# Patient Record
Sex: Female | Born: 1990 | Race: Black or African American | Hispanic: No | Marital: Single | Smoking: Never smoker
Health system: Southern US, Community
[De-identification: ages and names within clinical notes are randomized; demographics above are authoritative.]

## PROBLEM LIST (undated history)

## (undated) DIAGNOSIS — Z6841 Body Mass Index (BMI) 40.0 and over, adult: Secondary | ICD-10-CM

## (undated) DIAGNOSIS — B999 Unspecified infectious disease: Secondary | ICD-10-CM

## (undated) DIAGNOSIS — Z8619 Personal history of other infectious and parasitic diseases: Secondary | ICD-10-CM

## (undated) HISTORY — PX: WISDOM TOOTH EXTRACTION: SHX21

## (undated) HISTORY — PX: VAGINA SURGERY: SHX829

## (undated) HISTORY — DX: Unspecified infectious disease: B99.9

## (undated) HISTORY — DX: Morbid (severe) obesity due to excess calories: E66.01

## (undated) HISTORY — DX: Body Mass Index (BMI) 40.0 and over, adult: Z684

## (undated) HISTORY — DX: Personal history of other infectious and parasitic diseases: Z86.19

## (undated) HISTORY — PX: HYMENECTOMY: SHX987

## (undated) HISTORY — PX: ARTHROSCOPIC REPAIR ACL: SUR80

---
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
## 2011-07-06 ENCOUNTER — Inpatient Hospital Stay (INDEPENDENT_AMBULATORY_CARE_PROVIDER_SITE_OTHER)
Admission: RE | Admit: 2011-07-06 | Discharge: 2011-07-06 | Disposition: A | Discharge status: Home / Self Care | Payer: Medicaid Other | Source: Ambulatory Visit | Attending: Family Medicine | Admitting: Family Medicine

## 2011-07-06 DIAGNOSIS — S93409A Sprain of unspecified ligament of unspecified ankle, initial encounter: Secondary | ICD-10-CM

## 2012-02-23 ENCOUNTER — Ambulatory Visit (INDEPENDENT_AMBULATORY_CARE_PROVIDER_SITE_OTHER): Payer: Medicaid Other | Admitting: Obstetrics and Gynecology

## 2012-02-23 ENCOUNTER — Encounter: Payer: Self-pay | Admitting: Obstetrics and Gynecology

## 2012-02-23 VITALS — BP 101/64 | HR 77 | Ht 60.0 in | Wt 221.0 lb

## 2012-02-23 DIAGNOSIS — Z113 Encounter for screening for infections with a predominantly sexual mode of transmission: Secondary | ICD-10-CM

## 2012-02-23 DIAGNOSIS — Z30017 Encounter for initial prescription of implantable subdermal contraceptive: Secondary | ICD-10-CM

## 2012-02-23 DIAGNOSIS — Z3046 Encounter for surveillance of implantable subdermal contraceptive: Secondary | ICD-10-CM

## 2012-02-23 NOTE — Progress Notes (Signed)
  Subjective:    Patient ID: Sonya Schwartz, female    DOB: [birth year], 21 y.o.   MRN: [id number]  HPI 21 yo GO with BMI 43 presenting today for implanon removal and re-insertion. Patient had implanon placed in May 2010. Patient is satisfied with this method of birth control reporting occasional spotting every 3 months. Patient also requesting STD testing. Patient otherwise without complaints   Past Medical History  Diagnosis Date  . Infection 2011    CHLAMYDIA   Past Surgical History  Procedure Date  . Wisdom tooth extraction     x 4  . Vagina surgery     at age 51. for menstrual flow.   Family History  Problem Relation Age of Onset  . Hypertension Mother   . Heart disease Mother   . Arthritis Mother   . Bronchiolitis Brother   . Bronchiolitis Sister    History  Substance Use Topics  . Smoking status: Never Smoker   . Smokeless tobacco: Not on file  . Alcohol Use: No     rare    Review of Systems     Objective:   Physical Exam  GENERAL: Well-developed, well-nourished female in no acute distress.  ABDOMEN: Soft, nontender, nondistended. No organomegaly. PELVIC: Normal external female genitalia. Vagina is pink and rugated.  Normal discharge. Normal appearing cervix. Uterus is normal in size.  No adnexal mass or tenderness. EXTREMITIES: No cyanosis, clubbing, or edema, 2+ distal pulses.       Assessment & Plan:  Patient given informed consent, signed copy in the chart, time out was performed. Pregnancy test was negative.  Removal Patient given informed consent for removal of her Implanon, time out was performed.  Signed copy in the chart.  Appropriate time out taken. Implanon site identified.  Area prepped in usual sterile fashon. One cc of 1% lidocaine was used to anesthetize the area at the distal end of the implant. A small stab incision was made right beside the implant on the distal portion.  The implanon rod was grasped using hemostats and removed without  difficulty.  There was less than 3 cc blood loss. There were no complications.    INSERTION The device was confirmed in needle. Using the previously made incision and then inserted full length of needle and withdrawn per handbook instructions.  Pt insertion site covered with 4x4.   Minimal blood loss.  Pt tolerated the procedure well. A small amount of antibiotic ointment and steri-strips were applied over the small incision.  A pressure bandage was applied to reduce any bruising.  The patient tolerated the procedure well and was given post procedure instructions.

## 2012-02-24 LAB — GC PROBE AMPLIFICATION, URINE: GC Probe Amp, Urine: NEGATIVE

## 2012-03-09 ENCOUNTER — Ambulatory Visit: Payer: Medicaid Other | Admitting: Obstetrics & Gynecology

## 2012-03-15 ENCOUNTER — Ambulatory Visit: Payer: Medicaid Other | Admitting: Obstetrics & Gynecology

## 2012-04-08 ENCOUNTER — Encounter (HOSPITAL_COMMUNITY): Payer: Self-pay | Admitting: *Deleted

## 2012-04-08 ENCOUNTER — Emergency Department (HOSPITAL_COMMUNITY)
Admission: EM | Admit: 2012-04-08 | Discharge: 2012-04-08 | Disposition: A | Discharge status: Home / Self Care | Payer: Medicaid Other | Attending: Emergency Medicine | Admitting: Emergency Medicine

## 2012-04-08 DIAGNOSIS — S30860A Insect bite (nonvenomous) of lower back and pelvis, initial encounter: Secondary | ICD-10-CM | POA: Insufficient documentation

## 2012-04-08 DIAGNOSIS — W57XXXA Bitten or stung by nonvenomous insect and other nonvenomous arthropods, initial encounter: Secondary | ICD-10-CM | POA: Insufficient documentation

## 2012-04-08 DIAGNOSIS — S90569A Insect bite (nonvenomous), unspecified ankle, initial encounter: Secondary | ICD-10-CM | POA: Insufficient documentation

## 2012-04-08 MED ORDER — DIPHENHYDRAMINE HCL 25 MG PO CAPS: 50.0000 mg | ORAL_CAPSULE | Freq: Once | ORAL | Status: DC

## 2012-04-08 MED ORDER — DIPHENHYDRAMINE HCL 50 MG PO CAPS: 50.0000 mg | ORAL_CAPSULE | ORAL | Status: DC | PRN

## 2012-04-08 NOTE — ED Provider Notes (Signed)
History     CSN: [id number]  Arrival date & time 04/08/12  Sonya Schwartz   First MD Initiated Contact with Patient 04/08/12 1957      Chief Complaint  Patient presents with  . 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  Patient is a 21 y.o. female presenting with rash. The history is provided by the patient. No language interpreter was used.  Rash  This is a new problem. The current episode started yesterday. The problem has not changed since onset.The problem is associated with nothing. There has been no fever. The rash is present on the abdomen and left upper leg. The pain is at a severity of 0/10. The patient is experiencing no pain. The pain has been constant since onset. Associated symptoms include itching. She has tried nothing for the symptoms. The treatment provided no relief.    Past Medical History  Diagnosis Date  . Infection 2011    CHLAMYDIA    Past Surgical History  Procedure Date  . Wisdom tooth extraction     x 4  . Vagina surgery     at age 89. for menstrual flow.    Family History  Problem Relation Age of Onset  . Hypertension Mother   . Heart disease Mother   . Arthritis Mother   . Bronchiolitis Brother   . Bronchiolitis Sister     History  Substance Use Topics  . Smoking status: Never Smoker   . Smokeless tobacco: Not on file  . Alcohol Use: No     rare    OB History    Grav Para Term Preterm Abortions TAB SAB Ect Mult Living   0 0 0 0 0 0 0 0 0 0       Review of Systems  Constitutional: Negative for fever and chills.  HENT: Negative for facial swelling and neck pain.   Eyes: Negative for pain.  Respiratory: Negative for  chest tightness and shortness of breath.   Cardiovascular: Negative for chest pain.  Gastrointestinal: Negative for nausea, vomiting and abdominal pain.  Genitourinary: Negative for dysuria.  Musculoskeletal: Negative for back pain.  Skin: Positive for itching and rash.  Neurological: Negative for headaches.  All other systems reviewed and are negative.    Allergies  Review of patient's allergies indicates no known allergies.  Home Medications   Current Outpatient Rx  Name Route Sig Dispense Refill  . IBUPROFEN 200 MG PO TABS Oral Take 400 mg by mouth every 6 (six) hours as needed. For pain      BP 130/79  Pulse 104  Temp 98.7 F (37.1 C) (Oral)  Resp 16  SpO2 100%  Physical Exam  Nursing note and vitals reviewed. Constitutional: She is oriented to person, place, and time. She appears well-developed and well-nourished. No distress.  HENT:  Head: Normocephalic and atraumatic.  Right Ear: External ear normal.  Left Ear: External ear normal.  Nose: Nose normal.  Mouth/Throat: Oropharynx is clear and moist. No oropharyngeal exudate.  Eyes: Conjunctivae are normal. Pupils are equal, round, and reactive to light. No scleral icterus.  Neck: Normal range of motion. Neck supple.  Cardiovascular: Normal rate, regular rhythm and normal heart sounds.  Exam  reveals no gallop and no friction rub.   No murmur heard. Pulmonary/Chest: Effort normal and breath sounds normal. No respiratory distress. She has no wheezes. She has no rales. She exhibits no tenderness.  Abdominal: Soft. Bowel sounds are normal. She exhibits no distension and no mass. There is no tenderness. There is no rebound and no guarding.  Musculoskeletal: Normal range of motion. She exhibits no edema and no tenderness.  Lymphadenopathy:    She has no cervical adenopathy.  Neurological: She is alert and oriented to person, place, and time. No cranial nerve deficit. She exhibits normal muscle tone. Coordination normal.    Skin: Skin is warm and dry. Rash noted. No erythema. No pallor.       Six small erythematous raised areas to abdomen and left upper thigh  Psychiatric: She has a normal mood and affect. Her behavior is normal. Judgment and thought content normal.    ED Course  Procedures (including critical care time)  Labs Reviewed - No data to display No results found.   Insect bites   MDM  Patient here with what appears to be mosquito bites without infection - denies any other complaint but itching.        Izola Price [place], [place] 04/08/12 2005

## 2012-04-08 NOTE — Discharge Instructions (Signed)
Insect Bite Mosquitoes, flies, fleas, bedbugs, and many other insects can bite. Insect bites are different from insect stings. A sting is when venom is injected into the skin. Some insect bites can transmit infectious diseases. SYMPTOMS  Insect bites usually turn red, swell, and itch for 2 to 4 days. They often go away on their own. TREATMENT  Your caregiver may prescribe antibiotic medicines if a bacterial infection develops in the bite. HOME CARE INSTRUCTIONS  Do not scratch the bite area.   Keep the bite area clean and dry. Wash the bite area thoroughly with soap and water.   Put ice or cool compresses on the bite area.   Put ice in a plastic bag.   Place a towel between your skin and the bag.   Leave the ice on for 20 minutes, 4 times a day for the first 2 to 3 days, or as directed.   You may apply a baking soda paste, cortisone cream, or calamine lotion to the bite area as directed by your caregiver. This can help reduce itching and swelling.   Only take over-the-counter or prescription medicines as directed by your caregiver.   If you are given antibiotics, take them as directed. Finish them even if you start to feel better.  You may need a tetanus shot if:  You cannot remember when you had your last tetanus shot.   You have never had a tetanus shot.   The injury broke your skin.  If you get a tetanus shot, your arm may swell, get red, and feel warm to the touch. This is common and not a problem. If you need a tetanus shot and you choose not to have one, there is a rare chance of getting tetanus. Sickness from tetanus can be serious. SEEK IMMEDIATE MEDICAL CARE IF:   You have increased pain, redness, or swelling in the bite area.   You see a red line on the skin coming from the bite.   You have a fever.   You have joint pain.   You have a headache or neck pain.   You have unusual weakness.   You have a rash.   You have chest pain or shortness of breath.   You  have abdominal pain, nausea, or vomiting.   You feel unusually tired or sleepy.  MAKE SURE YOU:   Understand these instructions.   Will watch your condition.   Will get help right away if you are not doing well or get worse.  Document Released: 11/04/2004 Document Revised: 09/16/2011 Document Reviewed: 04/28/2011 Exit[employer] Patient Information 2012 [employer]. 

## 2012-04-08 NOTE — ED Notes (Signed)
Pt states yesterday she noticed bumps on her stomach and upper left thigh. Pt saes she went to work and the rash decreased until about an hour ago the rash started beginning to be redder and raised and itch.

## 2012-04-09 NOTE — ED Provider Notes (Signed)
Medical screening examination/treatment/procedure(s) were performed by non-physician practitioner and as supervising physician I was immediately available for consultation/collaboration.  Ethelda Chick, MD 04/09/12 [phone]

## 2012-05-12 ENCOUNTER — Ambulatory Visit (INDEPENDENT_AMBULATORY_CARE_PROVIDER_SITE_OTHER): Payer: Medicaid Other | Admitting: Family Medicine

## 2012-05-12 VITALS — BP 87/54 | HR 67 | Ht 60.0 in | Wt 227.0 lb

## 2012-05-12 DIAGNOSIS — Z7251 High risk heterosexual behavior: Secondary | ICD-10-CM

## 2012-05-12 DIAGNOSIS — E669 Obesity, unspecified: Secondary | ICD-10-CM

## 2012-05-12 LAB — HEPATITIS C ANTIBODY: HCV Ab: NEGATIVE

## 2012-05-12 LAB — RPR

## 2012-05-12 LAB — HEPATITIS B SURFACE ANTIGEN: Hepatitis B Surface Ag: NEGATIVE

## 2012-05-12 NOTE — Progress Notes (Signed)
  Subjective:    Patient ID: Sonya Schwartz, female    DOB: 10-28-1990, 21 y.o.   MRN: [id number]  HPI  Here for NST testing. Has one partner generally but had sex with another partner over the last weekend. She thinks she may have taken off the condom during intercourse. She desires for NST testing. When she was here in May she also requested total STD testing however a venipuncture could not be done.  Review of Systems  Constitutional: Negative for fever and chills.  Gastrointestinal: Negative for nausea, vomiting, abdominal pain and abdominal distention.  Genitourinary: Negative for vaginal discharge and pelvic pain.       Objective:   Physical Exam  Vitals reviewed. Constitutional: She appears well-developed and well-nourished. No distress.  Eyes: No scleral icterus.  Neck: Neck supple.  Cardiovascular: Normal rate.   Pulmonary/Chest: Effort normal.  Abdominal: Soft. There is no tenderness.          Assessment & Plan:   1. Problems related to high-risk sexual behavior  HIV antibody, Hepatitis B surface antigen, Hepatitis C antibody, RPR, GC/chlamydia probe amp, urine  2. Obesity

## 2012-05-12 NOTE — Patient Instructions (Signed)
Safer Sex Your caregiver wants you to have this information about the infections that can be transmitted from sexual contact and how to prevent them. The idea behind safer sex is that you can be sexually active, and at the same time reduce the risk of giving or getting a sexually transmitted disease (STD). Every person should be aware of how to prevent him or herself and his or her sex partner from getting an STD. CAUSES OF STDS STDs are transmitted by sharing body fluids, which contain viruses and bacteria. The following fluids all transmit infections during sexual intercourse and sex acts:  Semen.   Saliva.   Urine.   Blood.   Vaginal mucus.  Examples of STDs include:  Chlamydia.   Gonorrhea.   Genital herpes.   Hepatitis B.   Human immunodeficiency virus or acquired immunodeficiency syndrome (HIV or AIDS).   Syphilis.   Trichomonas.   Pubic lice.   Human papillomavirus (HPV), which may include:   Genital warts.   Cervical dysplasia.   Cervical cancer (can develop with certain types of HPV).  SYMPTOMS  Sexual diseases often cause few or no symptoms until they are advanced, so a person can be infected and spread the infection without knowing it. Some STDs respond to treatment very well. Others, like HIV and herpes, cannot be cured, but are treated to reduce their effects. Specific symptoms include:  Abnormal vaginal discharge.   Irritation or itching in and around the vagina, and in the pubic hair.   Pain during sexual intercourse.   Bleeding during sexual intercourse.   Pelvic or abdominal pain.   Fever.   Growths in and around the vagina.   An ulcer in or around the vagina.   Swollen glands in the groin area.  DIAGNOSIS   Blood tests.   Pap test.   Culture test of abnormal vaginal discharge.   A test that applies a solution and examines the cervix with a lighted magnifying scope (colposcopy).   A test that examines the pelvis with a lighted  tube, through a small incision (laparoscopy).  TREATMENT  The treatment will depend on the cause of the STD.  Antibiotic treatment by injection, oral, creams, or suppositories in the vagina.   Over-the-counter medicated shampoo, to get rid of pubic lice.   Removing or treating growths with medicine, freezing, burning (electrocautery), or surgery.   Surgery treatment for HPV of the cervix.   Supportive medicines for herpes, HIV, AIDS, and hepatitis.  Being careful cannot eliminate all risk of infection, but sex can be made much safer. Safe sexual practices include body massage and gentle touching. Masturbation is safe, as long as body fluids do not contact skin that has sores or cuts. Dry kissing and oral sex on a man wearing a latex condom or on a woman wearing a female condom is also safe. Slightly less safe is intercourse while the man wears a latex condom or wet kissing. It is also safer to have one sex partner that you know is not having sex with anyone else. LENGTH OF ILLNESS An STD might be treated and cured in a week, sometimes a month, or more. And it can linger with symptoms for many years. STDs can also cause damage to the female organs. This can cause chronic pain, infertility, and recurrence of the STD, especially herpes, hepatitis, HIV, and HPV. HOME CARE INSTRUCTIONS AND PREVENTION  Alcohol and recreational drugs are often the reason given for not practicing safer sex. These substances affect   your judgment. Alcohol and recreational drugs can also impair your immune system, making you more vulnerable to disease.   Do not engage in risky and dangerous sexual practices, including:   Vaginal or anal sex without a condom.   Oral sex on a man without a condom.   Oral sex on a woman without a female condom.   Using saliva to lubricate a condom.   Any other sexual contact in which body fluids or blood from one partner contact the other partner.   You should use only latex  condoms for men and water soluble lubricants. Petroleum based lubricants or oils used to lubricate a condom will weaken the condom and increase the chance that it will break.   Think very carefully before having sex with anyone who is high risk for STDs and HIV. This includes IV drug users, people with multiple sexual partners, or people who have had an STD, or a positive hepatitis or HIV blood test.   Remember that even if your partner has had only one previous partner, their previous partner might have had multiple partners. If so, you are at high risk of being exposed to an STD. You and your sex partner should be the only sex partners with each other, with no one else involved.   A vaccine is available for hepatitis B and HPV through your caregiver or the Public Health Department. Everyone should be vaccinated with these vaccines.   Avoid risky sex practices. Sex acts that can break the skin make you more likely to get an STD.  SEEK MEDICAL CARE IF:   If you think you have an STD, even if you do not have any symptoms. Contact your caregiver for evaluation and treatment, if needed.   You think or know your sex partner has acquired an STD.   You have any of the symptoms mentioned above.  Document Released: 11/04/2004 Document Revised: 09/16/2011 Document Reviewed: 08/27/2009 Exit[employer] Patient Information 2012 [employer]. 

## 2012-09-15 ENCOUNTER — Emergency Department (HOSPITAL_COMMUNITY)
Admission: EM | Admit: 2012-09-15 | Discharge: 2012-09-15 | Disposition: A | Discharge status: Home / Self Care | Payer: Medicaid Other | Attending: Emergency Medicine | Admitting: Emergency Medicine

## 2012-09-15 ENCOUNTER — Encounter (HOSPITAL_COMMUNITY): Payer: Self-pay

## 2012-09-15 DIAGNOSIS — Z202 Contact with and (suspected) exposure to infections with a predominantly sexual mode of transmission: Secondary | ICD-10-CM | POA: Insufficient documentation

## 2012-09-15 DIAGNOSIS — Z3202 Encounter for pregnancy test, result negative: Secondary | ICD-10-CM | POA: Insufficient documentation

## 2012-09-15 DIAGNOSIS — Z8742 Personal history of other diseases of the female genital tract: Secondary | ICD-10-CM | POA: Insufficient documentation

## 2012-09-15 LAB — COMPREHENSIVE METABOLIC PANEL
ALT: 12 U/L (ref 0–35)
AST: 20 U/L (ref 0–37)
Calcium: 9.3 mg/dL (ref 8.4–10.5)
Creatinine, Ser: 0.62 mg/dL (ref 0.50–1.10)
GFR calc Af Amer: >90 mL/min (ref >90)
Sodium: 139 mEq/L (ref 135–145)
Total Protein: 7.7 g/dL (ref 6.0–8.3)

## 2012-09-15 LAB — CBC WITH DIFFERENTIAL/PLATELET
Basophils Absolute: 0 10*3/uL (ref 0.0–0.1)
Basophils Relative: 0 % (ref 0–1)
Eosinophils Absolute: 0.1 10*3/uL (ref 0.0–0.7)
Eosinophils Relative: 1 % (ref 0–5)
MCH: 28.9 pg (ref 26.0–34.0)
MCHC: 34.3 g/dL (ref 30.0–36.0)
MCV: 84.2 fL (ref 78.0–100.0)
Monocytes Absolute: 0.5 10*3/uL (ref 0.1–1.0)
Platelets: 291 10*3/uL (ref 150–400)
RDW: 12.8 % (ref 11.5–15.5)
WBC: 8 10*3/uL (ref 4.0–10.5)

## 2012-09-15 LAB — WET PREP, GENITAL
Clue Cells Wet Prep HPF POC: NONE SEEN
Trich, Wet Prep: NONE SEEN

## 2012-09-15 MED ORDER — CEFTRIAXONE SODIUM 250 MG IJ SOLR
250.0000 mg | Freq: Once | INTRAMUSCULAR | Status: AC
  Administered 2012-09-15: 250 mg via INTRAMUSCULAR
  Filled 2012-09-15: qty 250

## 2012-09-15 MED ORDER — AZITHROMYCIN 250 MG PO TABS
1000.0000 mg | ORAL_TABLET | Freq: Once | ORAL | Status: AC
  Administered 2012-09-15: 1000 mg via ORAL
  Filled 2012-09-15: qty 4

## 2012-09-15 NOTE — Discharge Instructions (Signed)
Chlamydia, Female Follow up with your doctor or the health department. Return to the ED if you develop new or worsening symptoms. Chlamydia is an infection caused by bacteria. It is spread through sexual contact. Chlamydia can be in different areas of the body. These areas include the cervix, urethra, throat, or rectum. If you are infected, you must finish all treatments and follow up with a caregiver.  CAUSES  Chlamydia is a sexually transmitted disease. It is passed from an infected partner during intimate contact. This contact could be with the genitals, mouth, or rectal area. Infections can also be passed from mothers to babies during birth. SYMPTOMS  There may not be any symptoms. This is often the case early in the infection. Symptoms you may notice include:  Mild pain and discomfort when urinating.  Inflammation of the rectum.  Vaginal discharge.  Painful intercourse.  Abdominal pain.  Bleeding between menstrual periods. DIAGNOSIS  To diagnose this infection, your caregiver will do a pelvic exam. Cultures will be taken of the vagina, cervix, urine, and possibly the rectum to see if the infection is chlamydia. TREATMENT You will be given antibiotic medicines. Any sexual partners should also be treated, even if they do not show symptoms. Take the medicine for the prescribed length of time. If you are pregnant, do not take tetracycline-type antibiotics. HOME CARE INSTRUCTIONS   Take your antibiotics as directed. Finish them even if you start to feel better.  Only take over-the-counter or prescription medicines for pain, discomfort, or fever as directed by your caregiver.  Inform any sexual partners about the infection. They should be treated also.  Do not have sexual contact until your caregiver tells you it is okay.  Get plenty of rest.  Eat a well-balanced diet, and drink enough fluids to keep your urine clear or pale yellow.  Keep all follow-up appointments and  tests. SEEK IMMEDIATE MEDICAL CARE IF:   Your symptoms return.  You have a fever. MAKE SURE YOU:   Understand these instructions.  Will watch your condition.  Will get help right away if you are not doing well or get worse. Document Released: 07/07/2005 Document Revised: 12/20/2011 Document Reviewed: 05/15/2008 [facility] Patient Information 2013 [place], [place].

## 2012-09-15 NOTE — ED Notes (Signed)
Pt sts she thinks the eprson she had intercourse with, last night, had a std

## 2012-09-15 NOTE — ED Notes (Signed)
Pt. Requested mouth to be assessed. No redness, swelling or deformity noted. Pt denies any pain.

## 2012-09-15 NOTE — ED Provider Notes (Signed)
History   This chart was scribed for Glynn Octave, MD by Toya Smothers, ED Scribe. The patient was seen in room TR05C/TR05C. Patient's care was started at 1418.  CSN: [id number]  Arrival date & time 09/15/12  1418   First MD Initiated Contact with Patient 09/15/12 1445      Chief Complaint  Patient presents with  . SEXUALLY TRANSMITTED DISEASE   HPI  Sonya Schwartz is a 21 y.o. female who presents to the Emergency Department asymptomatically to test for STDs. Pt reports having protected intercourse last night and believes may have been exposed. No dysuria, vaginal discharge, vaginal bleeding, abdominal pain, fever, chills, cough, congestion, rhinorrhea, chest pain, SOB, or n/v/d. Pt denies use of tobacco products, consumption of alcohol, and use of illicit drugs. Pertinent medical Hx includes Chlamydia 2011. Pt is currently using birth control and does not recall LNMP.    Past Medical History  Diagnosis Date  . Infection 2011    CHLAMYDIA    Past Surgical History  Procedure Date  . Wisdom tooth extraction     x 4  . Vagina surgery     at age 15. for menstrual flow.    Family History  Problem Relation Age of Onset  . Hypertension Mother   . Heart disease Mother   . Arthritis Mother   . Bronchiolitis Brother   . Bronchiolitis Sister     History  Substance Use Topics  . Smoking status: Never Smoker   . Smokeless tobacco: Not on file  . Alcohol Use: Yes     Comment: rare    Review of Systems  Genitourinary: Negative for dysuria, hematuria, vaginal bleeding, vaginal discharge, vaginal pain and menstrual problem.  All other systems reviewed and are negative.    Allergies  Review of patient's allergies indicates no known allergies.  Home Medications   Current Outpatient Rx  Name  Route  Sig  Dispense  Refill  . BIOTIN PO   Oral   Take 2 tablets by mouth daily.           BP 111/61  Pulse 84  Temp 98.3 F (36.8 C) (Oral)  Resp 16  SpO2  98%  Physical Exam  Nursing note and vitals reviewed. Constitutional: She appears well-developed and well-nourished.  HENT:  Head: Normocephalic and atraumatic.  Eyes: Conjunctivae normal are normal. Pupils are equal, round, and reactive to light.  Neck: Neck supple. No tracheal deviation present. No thyromegaly present.  Cardiovascular: Normal rate and regular rhythm.   No murmur heard. Pulmonary/Chest: Effort normal and breath sounds normal.  Abdominal: Soft. Bowel sounds are normal. She exhibits no distension. There is no tenderness.  Genitourinary: Cervix exhibits no motion tenderness and no discharge. Right adnexum displays no mass and no tenderness. Left adnexum displays no mass and no tenderness. No vaginal discharge found.  Musculoskeletal: Normal range of motion. She exhibits no edema and no tenderness.  Neurological: She is alert. Coordination normal.  Skin: Skin is warm and dry. No rash noted.  Psychiatric: She has a normal mood and affect.    ED Course  Procedures DIAGNOSTIC STUDIES: Oxygen Saturation is 98% on room air, normal by my interpretation.    COORDINATION OF CARE: 14:48- Evaluated Pt. Pt is awake, alert, and without distress.    Labs Reviewed  WET PREP, GENITAL - Abnormal; Notable for the following:    WBC, Wet Prep HPF POC MODERATE (*)     All other components within normal limits  CBC WITH DIFFERENTIAL  COMPREHENSIVE METABOLIC PANEL  POCT PREGNANCY, URINE  GC/CHLAMYDIA PROBE AMP  RPR   No results found.   1. Exposure to STD       MDM  Exposure to possible STD last night.  Denies symptoms.  No abdominal pain, fever, vomiting, discharge, bleeding.  Urinalysis negative. HCG negative. Pelvic exam benign. We'll treat empirically for GC and Chlamydia. Followup with health department  I personally performed the services described in this documentation, which was scribed in my presence. The recorded information has been reviewed and is  accurate.   Glynn Octave, MD 09/15/12 [phone]

## 2012-09-15 NOTE — ED Notes (Signed)
Pt reports she wants to be checked for STDs. States she had protected sex with a condom but noticed bumps to her partners penis. States she wants to make sure everything is ok.

## 2012-09-16 MED ORDER — ALBUTEROL SULFATE (5 MG/ML) 0.5% IN NEBU
INHALATION_SOLUTION | RESPIRATORY_TRACT | Status: AC
  Filled 2012-09-16: qty 1

## 2012-11-25 ENCOUNTER — Other Ambulatory Visit: Payer: Self-pay

## 2012-12-22 ENCOUNTER — Encounter (HOSPITAL_COMMUNITY): Payer: Self-pay | Admitting: Emergency Medicine

## 2012-12-22 DIAGNOSIS — Z3202 Encounter for pregnancy test, result negative: Secondary | ICD-10-CM | POA: Insufficient documentation

## 2012-12-22 DIAGNOSIS — N390 Urinary tract infection, site not specified: Secondary | ICD-10-CM | POA: Insufficient documentation

## 2012-12-22 DIAGNOSIS — Z8619 Personal history of other infectious and parasitic diseases: Secondary | ICD-10-CM | POA: Insufficient documentation

## 2012-12-22 DIAGNOSIS — Z202 Contact with and (suspected) exposure to infections with a predominantly sexual mode of transmission: Secondary | ICD-10-CM | POA: Insufficient documentation

## 2012-12-22 LAB — URINALYSIS, ROUTINE W REFLEX MICROSCOPIC
Ketones, ur: NEGATIVE mg/dL
Protein, ur: NEGATIVE mg/dL
Urobilinogen, UA: 1 mg/dL (ref 0.0–1.0)

## 2012-12-22 LAB — URINE MICROSCOPIC-ADD ON

## 2012-12-22 LAB — POCT PREGNANCY, URINE: Preg Test, Ur: NEGATIVE

## 2012-12-22 NOTE — ED Notes (Addendum)
Pt states her boyfriend has trichomonas and she needs to be treated.  Denies any symptoms.

## 2012-12-23 ENCOUNTER — Emergency Department (HOSPITAL_COMMUNITY)
Admission: EM | Admit: 2012-12-23 | Discharge: 2012-12-23 | Disposition: A | Discharge status: Home / Self Care | Payer: Medicaid Other | Attending: Emergency Medicine | Admitting: Emergency Medicine

## 2012-12-23 ENCOUNTER — Telehealth (HOSPITAL_COMMUNITY): Payer: Self-pay | Admitting: Emergency Medicine

## 2012-12-23 DIAGNOSIS — Z202 Contact with and (suspected) exposure to infections with a predominantly sexual mode of transmission: Secondary | ICD-10-CM

## 2012-12-23 MED ORDER — METRONIDAZOLE 500 MG PO TABS
2000.0000 mg | ORAL_TABLET | Freq: Once | ORAL | Status: AC
  Administered 2012-12-23: 2000 mg via ORAL
  Filled 2012-12-23: qty 4

## 2012-12-23 MED ORDER — NITROFURANTOIN MONOHYD MACRO 100 MG PO CAPS: 100.0000 mg | ORAL_CAPSULE | Freq: Two times a day (BID) | ORAL | Status: DC

## 2012-12-23 NOTE — ED Notes (Signed)
Patient calling about Rx.

## 2012-12-23 NOTE — ED Provider Notes (Signed)
History     CSN: [id number]  Arrival date & time 12/22/12  2238   First MD Initiated Contact with Patient 12/23/12 0207      Chief Complaint  Patient presents with  . STD Exposure     (Consider location/radiation/quality/duration/timing/severity/associated sxs/prior treatment) HPI This is a 22 year old female whose boyfriend called her yesterday and told her that he tested positive for trichomoniasis. He told her he was negative for gonorrhea and chlamydia. She is without symptoms and is here only for treatment of presumed trichomoniasis. Specifically she denies abdominal pain, dysuria, vaginal bleeding or vaginal discharge.  Past Medical History  Diagnosis Date  . Infection 2011    CHLAMYDIA    Past Surgical History  Procedure Laterality Date  . Wisdom tooth extraction      x 4  . Vagina surgery      at age 34. for menstrual flow.    Family History  Problem Relation Age of Onset  . Hypertension Mother   . Heart disease Mother   . Arthritis Mother   . Bronchiolitis Brother   . Bronchiolitis Sister     History  Substance Use Topics  . Smoking status: Never Smoker   . Smokeless tobacco: Not on file  . Alcohol Use: Yes     Comment: rare    OB History   Grav Para Term Preterm Abortions TAB SAB Ect Mult Living   0 0 0 0 0 0 0 0 0 0       Review of Systems  All other systems reviewed and are negative.    Allergies  Review of patient's allergies indicates no known allergies.  Home Medications  No current outpatient prescriptions on file.  BP 111/53  Pulse 80  Temp(Src) 98.3 F (36.8 C) (Oral)  Resp 18  SpO2 100%  LMP 12/08/2012  Physical Exam General: Well-developed, well-nourished female in no acute distress; appearance consistent with age of record HENT: normocephalic, atraumatic Eyes: pupils equal round and reactive to light; extraocular muscles intact Neck: supple Heart: regular rate and rhythm Lungs: clear to auscultation  bilaterally Abdomen: soft; nondistended; nontender; no masses or hepatosplenomegaly; bowel sounds present GU: no CVA tenderness Extremities: No deformity; full range of motion; pulses normal Neurologic: Awake, alert and oriented; motor function intact in all extremities and symmetric; no facial droop Skin: Warm and dry Psychiatric: Normal mood and affect    ED Course  Procedures (including critical care time)     MDM   Nursing notes and vitals signs, including pulse oximetry, reviewed.  Summary of this visit's results, reviewed by myself:  Labs:  Results for orders placed during the hospital encounter of 12/23/12 (from the past 24 hour(s))  URINALYSIS, ROUTINE W REFLEX MICROSCOPIC     Status: Abnormal   Collection Time    12/22/12 11:08 PM      Result Value Range   Color, Urine YELLOW  YELLOW   APPearance CLOUDY (*) CLEAR   Specific Gravity, Urine 1.028  1.005 - 1.030   pH 6.0  5.0 - 8.0   Glucose, UA NEGATIVE  NEGATIVE mg/dL   Hgb urine dipstick NEGATIVE  NEGATIVE   Bilirubin Urine NEGATIVE  NEGATIVE   Ketones, ur NEGATIVE  NEGATIVE mg/dL   Protein, ur NEGATIVE  NEGATIVE mg/dL   Urobilinogen, UA 1.0  0.0 - 1.0 mg/dL   Nitrite NEGATIVE  NEGATIVE   Leukocytes, UA MODERATE (*) NEGATIVE  URINE MICROSCOPIC-ADD ON     Status: Abnormal   Collection Time  12/22/12 11:08 PM      Result Value Range   Squamous Epithelial / LPF RARE  RARE   WBC, UA 7-10  <3 WBC/hpf   RBC / HPF 0-2  <3 RBC/hpf   Bacteria, UA FEW (*) RARE   Urine-Other MUCOUS PRESENT    POCT PREGNANCY, URINE     Status: None   Collection Time    12/22/12 11:17 PM      Result Value Range   Preg Test, Ur NEGATIVE  NEGATIVE             Hanley Seamen, MD 12/23/12 [phone]

## 2012-12-23 NOTE — ED Notes (Signed)
Pt denying any symptoms related to exposure.  States last intercourse was today.  Protection not used.

## 2012-12-23 NOTE — ED Notes (Signed)
Pt given discharge paperwork; pt verbalized understanding of d/c and f/u; no additional questions regarding d/c; e-signature obtained; VSS; resps e/u;

## 2012-12-24 LAB — URINE CULTURE

## 2013-08-16 ENCOUNTER — Other Ambulatory Visit: Payer: Self-pay

## 2013-11-25 ENCOUNTER — Encounter (HOSPITAL_COMMUNITY): Payer: Self-pay | Admitting: Emergency Medicine

## 2013-11-25 ENCOUNTER — Emergency Department (HOSPITAL_COMMUNITY)
Admission: EM | Admit: 2013-11-25 | Discharge: 2013-11-25 | Disposition: A | Discharge status: Home / Self Care | Payer: Medicaid Other | Attending: Emergency Medicine | Admitting: Emergency Medicine

## 2013-11-25 DIAGNOSIS — Z8619 Personal history of other infectious and parasitic diseases: Secondary | ICD-10-CM | POA: Insufficient documentation

## 2013-11-25 DIAGNOSIS — J029 Acute pharyngitis, unspecified: Secondary | ICD-10-CM | POA: Insufficient documentation

## 2013-11-25 DIAGNOSIS — J069 Acute upper respiratory infection, unspecified: Secondary | ICD-10-CM | POA: Insufficient documentation

## 2013-11-25 LAB — RAPID STREP SCREEN (MED CTR MEBANE ONLY): STREPTOCOCCUS, GROUP A SCREEN (DIRECT): NEGATIVE

## 2013-11-25 MED ORDER — TRAMADOL HCL 50 MG PO TABS
50.0000 mg | ORAL_TABLET | Freq: Once | ORAL | Status: AC
  Administered 2013-11-25: 50 mg via ORAL
  Filled 2013-11-25: qty 1

## 2013-11-25 MED ORDER — TRAMADOL HCL 50 MG PO TABS: 50.0000 mg | ORAL_TABLET | Freq: Four times a day (QID) | ORAL | Status: DC | PRN

## 2013-11-25 MED ORDER — DEXAMETHASONE 6 MG PO TABS
12.0000 mg | ORAL_TABLET | Freq: Once | ORAL | Status: AC
  Administered 2013-11-25: 12 mg via ORAL
  Filled 2013-11-25: qty 2

## 2013-11-25 NOTE — ED Notes (Signed)
Pt reports sore throat x 3 days. Has been able to eat and drink without difficulty.  Bilat ear pain, she feels it is due to her throat hurting.

## 2013-11-25 NOTE — ED Provider Notes (Signed)
CSN: [id number]     Arrival date & time 11/25/13  0020 History   First MD Initiated Contact with Patient 11/25/13 0045     Chief Complaint  Patient presents with  . Sore Throat     (Consider location/radiation/quality/duration/timing/severity/associated sxs/prior Treatment) Patient is a 23 y.o. female presenting with pharyngitis. The history is provided by the patient.  Sore Throat  She has had a sore throat for the last 5 days which is getting worse. There is associated sinus congestion and rhinorrhea with postnasal drainage. She denies fever, chills, sweats. There's been a cough productive of some clear mucus which is occasionally blood streaked. There's been no nausea vomiting or diarrhea. She denies arthralgias or myalgias. She's complaining of pain when she swallows that her voice has been hoarse. There's been no difficulty breathing. She denies any sick contacts.  Past Medical History  Diagnosis Date  . Infection 2011    CHLAMYDIA   Past Surgical History  Procedure Laterality Date  . Wisdom tooth extraction      x 4  . Vagina surgery      at age 89. for menstrual flow.   Family History  Problem Relation Age of Onset  . Hypertension Mother   . Heart disease Mother   . Arthritis Mother   . Bronchiolitis Brother   . Bronchiolitis Sister    History  Substance Use Topics  . Smoking status: Never Smoker   . Smokeless tobacco: Not on file  . Alcohol Use: Yes     Comment: rare   OB History   Grav Para Term Preterm Abortions TAB SAB Ect Mult Living   0 0 0 0 0 0 0 0 0 0      Review of Systems  All other systems reviewed and are negative.      Allergies  Review of patient's allergies indicates no known allergies.  Home Medications   Current Outpatient Rx  Name  Route  Sig  Dispense  Refill  . nitrofurantoin, macrocrystal-monohydrate, (MACROBID) 100 MG capsule   Oral   Take 1 capsule (100 mg total) by mouth 2 (two) times daily. X 7 days   14 capsule   0     BP 109/72  Pulse 88  Temp(Src) 98.6 F (37 C)  Resp 20  Ht 5' (1.524 m)  Wt 243 lb (110.224 kg)  BMI 47.46 kg/m2  SpO2 98% Physical Exam  Nursing note and vitals reviewed.  23 year old female, resting comfortably and in no acute distress. Vital signs are normal. Oxygen saturation is 98%, which is normal. Head is normocephalic and atraumatic. PERRLA, EOMI. Oropharynx is clear. There she has no difficulty in tolerating secretions but her voice is somewhat hoarse. There is no stridor. Neck is nontender and supple without adenopathy or JVD. Back is nontender and there is no CVA tenderness. Lungs are clear without rales, wheezes, or rhonchi. Chest is nontender. Heart has regular rate and rhythm without murmur. Abdomen is soft, flat, nontender without masses or hepatosplenomegaly and peristalsis is normoactive. Extremities have no cyanosis or edema, full range of motion is present. Skin is warm and dry without rash. Neurologic: Mental status is normal, cranial nerves are intact, there are no motor or sensory deficits.   ED Course  Procedures (including critical care time) Labs Review Results for orders placed during the hospital encounter of 11/25/13  RAPID STREP SCREEN      Result Value Ref Range   Streptococcus, Group A Screen (Direct) NEGATIVE  NEGATIVE  MDM   Final diagnoses:  Upper respiratory infection  Pharyngitis    Upper respiratory infection with pharyngitis. Strep screen is been obtained and she's given a dose of dexamethasone.  Strep screen is negative. She is discharged with prescription for tramadol for pain.  Dione Booze, MD 11/25/13 [phone]

## 2013-11-25 NOTE — ED Notes (Signed)
The pt has had a sore throat since Wednesday getting worse with n and v.  Intermittent temp

## 2013-11-25 NOTE — Discharge Instructions (Signed)
Sore Throat A sore throat is pain, burning, irritation, or scratchiness of the throat. There is often pain or tenderness when swallowing or talking. A sore throat may be accompanied by other symptoms, such as coughing, sneezing, fever, and swollen neck glands. A sore throat is often the first sign of another sickness, such as a cold, flu, strep throat, or mononucleosis (commonly known as mono). Most sore throats go away without medical treatment. CAUSES  The most common causes of a sore throat include:  A viral infection, such as a cold, flu, or mono.  A bacterial infection, such as strep throat, tonsillitis, or whooping cough.  Seasonal allergies.  Dryness in the air.  Irritants, such as smoke or pollution.  Gastroesophageal reflux disease (GERD). HOME CARE INSTRUCTIONS   Only take over-the-counter medicines as directed by your caregiver.  Drink enough fluids to keep your urine clear or pale yellow.  Rest as needed.  Try using throat sprays, lozenges, or sucking on hard candy to ease any pain (if older than 4 years or as directed).  Sip warm liquids, such as broth, herbal tea, or warm water with honey to relieve pain temporarily. You may also eat or drink cold or frozen liquids such as frozen ice pops.  Gargle with salt water (mix 1 tsp salt with 8 oz of water).  Do not smoke and avoid secondhand smoke.  Put a cool-mist humidifier in your bedroom at night to moisten the air. You can also turn on a hot shower and sit in the bathroom with the door closed for 5 10 minutes. SEEK IMMEDIATE MEDICAL CARE IF:  You have difficulty breathing.  You are unable to swallow fluids, soft foods, or your saliva.  You have increased swelling in the throat.  Your sore throat does not get better in 7 days.  You have nausea and vomiting.  You have a fever or persistent symptoms for more than 2 3 days.  You have a fever and your symptoms suddenly get worse. MAKE SURE YOU:   Understand  these instructions.  Will watch your condition.  Will get help right away if you are not doing well or get worse. Document Released: 11/04/2004 Document Revised: 09/13/2012 Document Reviewed: 06/04/2012 [facility] Patient Information 2014 [place], [place].  Upper Respiratory Infection, Adult An upper respiratory infection (URI) is also sometimes known as the common cold. The upper respiratory tract includes the nose, sinuses, throat, trachea, and bronchi. Bronchi are the airways leading to the lungs. Most people improve within 1 week, but symptoms can last up to 2 weeks. A residual cough may last even longer.  CAUSES Many different viruses can infect the tissues lining the upper respiratory tract. The tissues become irritated and inflamed and often become very moist. Mucus production is also common. A cold is contagious. You can easily spread the virus to others by oral contact. This includes kissing, sharing a glass, coughing, or sneezing. Touching your mouth or nose and then touching a surface, which is then touched by another person, can also spread the virus. SYMPTOMS  Symptoms typically develop 1 to 3 days after you come in contact with a cold virus. Symptoms vary from person to person. They may include:  Runny nose.  Sneezing.  Nasal congestion.  Sinus irritation.  Sore throat.  Loss of voice (laryngitis).  Cough.  Fatigue.  Muscle aches.  Loss of appetite.  Headache.  Low-grade fever. DIAGNOSIS  You might diagnose your own cold based on familiar symptoms, since most people get  a cold 2 to 3 times a year. Your caregiver can confirm this based on your exam. Most importantly, your caregiver can check that your symptoms are not due to another disease such as strep throat, sinusitis, pneumonia, asthma, or epiglottitis. Blood tests, throat tests, and X-rays are not necessary to diagnose a common cold, but they may sometimes be helpful in excluding other more serious diseases.  Your caregiver will decide if any further tests are required. RISKS AND COMPLICATIONS  You may be at risk for a more severe case of the common cold if you smoke cigarettes, have chronic heart disease (such as heart failure) or lung disease (such as asthma), or if you have a weakened immune system. The very young and very old are also at risk for more serious infections. Bacterial sinusitis, middle ear infections, and bacterial pneumonia can complicate the common cold. The common cold can worsen asthma and chronic obstructive pulmonary disease (COPD). Sometimes, these complications can require emergency medical care and may be life-threatening. PREVENTION  The best way to protect against getting a cold is to practice good hygiene. Avoid oral or hand contact with people with cold symptoms. Wash your hands often if contact occurs. There is no clear evidence that vitamin C, vitamin E, echinacea, or exercise reduces the chance of developing a cold. However, it is always recommended to get plenty of rest and practice good nutrition. TREATMENT  Treatment is directed at relieving symptoms. There is no cure. Antibiotics are not effective, because the infection is caused by a virus, not by bacteria. Treatment may include:  Increased fluid intake. Sports drinks offer valuable electrolytes, sugars, and fluids.  Breathing heated mist or steam (vaporizer or shower).  Eating chicken soup or other clear broths, and maintaining good nutrition.  Getting plenty of rest.  Using gargles or lozenges for comfort.  Controlling fevers with ibuprofen or acetaminophen as directed by your caregiver.  Increasing usage of your inhaler if you have asthma. Zinc gel and zinc lozenges, taken in the first 24 hours of the common cold, can shorten the duration and lessen the severity of symptoms. Pain medicines may help with fever, muscle aches, and throat pain. A variety of non-prescription medicines are available to treat  congestion and runny nose. Your caregiver can make recommendations and may suggest nasal or lung inhalers for other symptoms.  HOME CARE INSTRUCTIONS   Only take over-the-counter or prescription medicines for pain, discomfort, or fever as directed by your caregiver.  Use a warm mist humidifier or inhale steam from a shower to increase air moisture. This may keep secretions moist and make it easier to breathe.  Drink enough water and fluids to keep your urine clear or pale yellow.  Rest as needed.  Return to work when your temperature has returned to normal or as your caregiver advises. You may need to stay home longer to avoid infecting others. You can also use a face mask and careful hand washing to prevent spread of the virus. SEEK MEDICAL CARE IF:   After the first few days, you feel you are getting worse rather than better.  You need your caregiver's advice about medicines to control symptoms.  You develop chills, worsening shortness of breath, or brown or red sputum. These may be signs of pneumonia.  You develop yellow or brown nasal discharge or pain in the face, especially when you bend forward. These may be signs of sinusitis.  You develop a fever, swollen neck glands, pain with swallowing, or white  areas in the back of your throat. These may be signs of strep throat. SEEK IMMEDIATE MEDICAL CARE IF:   You have a fever.  You develop severe or persistent headache, ear pain, sinus pain, or chest pain.  You develop wheezing, a prolonged cough, cough up blood, or have a change in your usual mucus (if you have chronic lung disease).  You develop sore muscles or a stiff neck. Document Released: 03/23/2001 Document Revised: 12/20/2011 Document Reviewed: 01/29/2011 [facility] Patient Information 2014 [place], [place].  Tramadol tablets What is this medicine? TRAMADOL (TRA ma dole) is a pain reliever. It is used to treat moderate to severe pain in adults. This medicine may be used  for other purposes; ask your health care provider or pharmacist if you have questions. COMMON BRAND NAME(S): Ultram What should I tell my health care provider before I take this medicine? They need to know if you have any of these conditions: -brain tumor -depression -drug abuse or addiction -head injury -if you frequently drink alcohol containing drinks -kidney disease or trouble passing urine -liver disease -lung disease, asthma, or breathing problems -seizures or epilepsy -suicidal thoughts, plans, or attempt; a previous suicide attempt by you or a family member -an unusual or allergic reaction to tramadol, codeine, other medicines, foods, dyes, or preservatives -pregnant or trying to get pregnant -breast-feeding How should I use this medicine? Take this medicine by mouth with a full glass of water. Follow the directions on the prescription label. If the medicine upsets your stomach, take it with food or milk. Do not take more medicine than you are told to take. Talk to your pediatrician regarding the use of this medicine in children. Special care may be needed. Overdosage: If you think you have taken too much of this medicine contact a poison control center or emergency room at once. NOTE: This medicine is only for you. Do not share this medicine with others. What if I miss a dose? If you miss a dose, take it as soon as you can. If it is almost time for your next dose, take only that dose. Do not take double or extra doses. What may interact with this medicine? Do not take this medicine with any of the following medications: -MAOIs like Carbex, Eldepryl, Marplan, Nardil, and Parnate This medicine may also interact with the following medications: -alcohol or medicines that contain alcohol -antihistamines -benzodiazepines -bupropion -carbamazepine or oxcarbazepine -clozapine -cyclobenzaprine -digoxin -furazolidone -linezolid -medicines for depression, anxiety, or psychotic  disturbances -medicines for migraine headache like almotriptan, eletriptan, frovatriptan, naratriptan, rizatriptan, sumatriptan, zolmitriptan -medicines for pain like pentazocine, buprenorphine, butorphanol, meperidine, nalbuphine, and propoxyphene -medicines for sleep -muscle relaxants -naltrexone -phenobarbital -phenothiazines like perphenazine, thioridazine, chlorpromazine, mesoridazine, fluphenazine, prochlorperazine, promazine, and trifluoperazine -procarbazine -warfarin This list may not describe all possible interactions. Give your health care provider a list of all the medicines, herbs, non-prescription drugs, or dietary supplements you use. Also tell them if you smoke, drink alcohol, or use illegal drugs. Some items may interact with your medicine. What should I watch for while using this medicine? Tell your doctor or health care professional if your pain does not go away, if it gets worse, or if you have new or a different type of pain. You may develop tolerance to the medicine. Tolerance means that you will need a higher dose of the medicine for pain relief. Tolerance is normal and is expected if you take this medicine for a long time. Do not suddenly stop taking your medicine because you  may develop a severe reaction. Your body becomes used to the medicine. This does NOT mean you are addicted. Addiction is a behavior related to getting and using a drug for a non-medical reason. If you have pain, you have a medical reason to take pain medicine. Your doctor will tell you how much medicine to take. If your doctor wants you to stop the medicine, the dose will be slowly lowered over time to avoid any side effects. You may get drowsy or dizzy. Do not drive, use machinery, or do anything that needs mental alertness until you know how this medicine affects you. Do not stand or sit up quickly, especially if you are an older patient. This reduces the risk of dizzy or fainting spells. Alcohol can  increase or decrease the effects of this medicine. Avoid alcoholic drinks. You may have constipation. Try to have a bowel movement at least every 2 to 3 days. If you do not have a bowel movement for 3 days, call your doctor or health care professional. Your mouth may get dry. Chewing sugarless gum or sucking hard candy, and drinking plenty of water may help. Contact your doctor if the problem does not go away or is severe. What side effects may I notice from receiving this medicine? Side effects that you should report to your doctor or health care professional as soon as possible: -allergic reactions like skin rash, itching or hives, swelling of the face, lips, or tongue -breathing difficulties, wheezing -confusion -itching -light headedness or fainting spells -redness, blistering, peeling or loosening of the skin, including inside the mouth -seizures Side effects that usually do not require medical attention (report to your doctor or health care professional if they continue or are bothersome): -constipation -dizziness -drowsiness -headache -nausea, vomiting This list may not describe all possible side effects. Call your doctor for medical advice about side effects. You may report side effects to FDA at 1-800-FDA-1088. Where should I keep my medicine? Keep out of the reach of children. Store at room temperature between 15 and 30 degrees C (59 and 86 degrees F). Keep container tightly closed. Throw away any unused medicine after the expiration date. NOTE: This sheet is a summary. It may not cover all possible information. If you have questions about this medicine, talk to your doctor, pharmacist, or health care provider.  2014, [employer]. (2010-06-10 11:55:44)

## 2013-11-28 LAB — CULTURE, GROUP A STREP

## 2013-11-29 NOTE — Progress Notes (Signed)
ED Antimicrobial Stewardship Positive Culture Follow Up   Sonya Schwartz is an 23 y.o. female who [facility] with a chief complaint of  Chief Complaint  Patient presents with  . Sore Throat    Recent Results (from the past 720 hour(s))  RAPID STREP SCREEN     Status: None   Collection Time    11/25/13 12:34 AM      Result Value Ref Range Status   Streptococcus, Group A Screen (Direct) NEGATIVE  NEGATIVE Final   Comment: (NOTE)     A Rapid Antigen test may result negative if the antigen level in the     sample is below the detection level of this test. The FDA has not     cleared this test as a stand-alone test therefore the rapid antigen     negative result has reflexed to a Group A Strep culture.  CULTURE, GROUP A STREP     Status: None   Collection Time    11/25/13 12:34 AM      Result Value Ref Range Status   Specimen Description THROAT   Final   Special Requests NONE   Final   Culture     Final   Value: GROUP A STREP (S.PYOGENES) ISOLATED     Performed at [facility]   Report Status 11/28/2013 FINAL   Final     [x]  Patient discharged originally without antimicrobial agent and treatment is now indicated  New antibiotic prescription: amoxicillin 500mg  po BID x 10 days  ED Provider: Fayrene HelperBowie Tran, PA-C   Mickeal SkinnerFrens, Courtenay Hirth John 11/29/2013, 10:08 AM Infectious Diseases Pharmacist Phone# [phone]

## 2013-11-29 NOTE — ED Notes (Signed)
Chart returned from EDP office . Plan Amoxicillin 500 mg po BID x 10 days written by Fayrene HelperBowie Tran

## 2013-12-03 ENCOUNTER — Telehealth (HOSPITAL_COMMUNITY): Payer: Self-pay | Admitting: *Deleted

## 2013-12-03 NOTE — ED Notes (Signed)
Unable to contact via phone letter sent to [facility] address. 

## 2014-08-06 ENCOUNTER — Ambulatory Visit (INDEPENDENT_AMBULATORY_CARE_PROVIDER_SITE_OTHER): Payer: BC Managed Care – PPO | Admitting: Obstetrics & Gynecology

## 2014-08-06 ENCOUNTER — Encounter: Payer: Self-pay | Admitting: Obstetrics & Gynecology

## 2014-08-06 VITALS — BP 97/69 | HR 81 | Ht 60.0 in | Wt 254.4 lb

## 2014-08-06 DIAGNOSIS — Z113 Encounter for screening for infections with a predominantly sexual mode of transmission: Secondary | ICD-10-CM | POA: Diagnosis not present

## 2014-08-06 DIAGNOSIS — Z124 Encounter for screening for malignant neoplasm of cervix: Secondary | ICD-10-CM | POA: Diagnosis not present

## 2014-08-06 DIAGNOSIS — Z0000 Encounter for general adult medical examination without abnormal findings: Secondary | ICD-10-CM

## 2014-08-06 DIAGNOSIS — Z1151 Encounter for screening for human papillomavirus (HPV): Secondary | ICD-10-CM | POA: Diagnosis not present

## 2014-08-06 DIAGNOSIS — Z01419 Encounter for gynecological examination (general) (routine) without abnormal findings: Secondary | ICD-10-CM

## 2014-08-06 LAB — CBC
HCT: 40.7 % (ref 36.0–46.0)
Hemoglobin: 13.5 g/dL (ref 12.0–15.0)
MCH: 28.7 pg (ref 26.0–34.0)
MCHC: 33.2 g/dL (ref 30.0–36.0)
MCV: 86.6 fL (ref 78.0–100.0)
Platelets: 278 10*3/uL (ref 150–400)
RBC: 4.7 MIL/uL (ref 3.87–5.11)
RDW: 13.3 % (ref 11.5–15.5)
WBC: 5.4 10*3/uL (ref 4.0–10.5)

## 2014-08-06 NOTE — Progress Notes (Signed)
Subjective:    Sonya Schwartz is a 23 y.o. S AA G0 female who presents for an annual exam. The patient has no complaints today. The patient is sexually active. GYN screening history: last pap: was normal. The patient wears seatbelts: yes. The patient participates in regular exercise: no. Has the patient ever been transfused or tattooed?: yes. The patient reports that there is not domestic violence in her life.   Menstrual History: OB History   Grav Para Term Preterm Abortions TAB SAB Ect Mult Living   0 0 0 0 0 0 0 0 0 0       Menarche age: [age]  No LMP recorded. Patient has had an implant.    The following portions of the patient's history were reviewed and updated as appropriate: allergies, current medications, past family history, past medical history, past social history, past surgical history and problem list.  Review of Systems Pertinent items are noted in HPI. Monogamous for a year. Wants STI testing. Declines a flu vaccine. 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 Denies dyspareunia.   Objective:    BP 97/69  Pulse 81  Ht 5' (1.524 m)  Wt 254 lb 6.4 oz (115.395 kg)  BMI 49.68 kg/m2  General Appearance:    Alert, cooperative, no distress, appears stated age  Head:    Normocephalic, without obvious abnormality, atraumatic  Eyes:    PERRL, conjunctiva/corneas clear, EOM's intact, fundi    benign, both eyes  Ears:    Normal TM's and external ear canals, both ears  Nose:   Nares normal, septum midline, mucosa normal, no drainage    or sinus tenderness  Throat:   Lips, mucosa, and tongue normal; teeth and gums normal  Neck:   Supple, symmetrical, trachea midline, no adenopathy;    thyroid:  no enlargement/tenderness/nodules; no carotid   bruit or JVD  Back:     Symmetric, no curvature, ROM normal, no CVA tenderness  Lungs:     Clear to auscultation bilaterally, respirations unlabored  Chest Wall:    No tenderness or deformity   Heart:    Regular rate and rhythm, S1  and S2 normal, no murmur, rub   or gallop  Breast Exam:    No tenderness, masses, or nipple abnormality  Abdomen:     Soft, non-tender, bowel sounds active all four quadrants,    no masses, no organomegaly  Genitalia:    Normal female without lesion, discharge or tenderness, ectropion and friable cervix noted. No masses on bimanual exam     Extremities:   Extremities normal, atraumatic, no cyanosis or edema  Pulses:   2+ and symmetric all extremities  Skin:   Skin color, texture, turgor normal, no rashes or lesions  Lymph nodes:   Cervical, supraclavicular, and axillary nodes normal  Neurologic:   CNII-XII intact, normal strength, sensation and reflexes    throughout  .    Assessment:    Healthy female exam.    Plan:     Breast self exam technique reviewed and patient encouraged to perform self-exam monthly. Chlamydia specimen. GC specimen. Thin prep Pap smear.  STI testing Referral to bariatric center

## 2014-08-07 LAB — COMPREHENSIVE METABOLIC PANEL
ALBUMIN: 3.9 g/dL (ref 3.5–5.2)
ALT: 10 U/L (ref 0–35)
AST: 17 U/L (ref 0–37)
Alkaline Phosphatase: 55 U/L (ref 39–117)
BUN: 13 mg/dL (ref 6–23)
CALCIUM: 8.7 mg/dL (ref 8.4–10.5)
CHLORIDE: 101 meq/L (ref 96–112)
CO2: 23 meq/L (ref 19–32)
Creat: 0.66 mg/dL (ref 0.50–1.10)
Glucose, Bld: 89 mg/dL (ref 70–99)
Potassium: 4.1 mEq/L (ref 3.5–5.3)
SODIUM: 135 meq/L (ref 135–145)
TOTAL PROTEIN: 6.9 g/dL (ref 6.0–8.3)
Total Bilirubin: 0.5 mg/dL (ref 0.2–1.2)

## 2014-08-07 LAB — HIV ANTIBODY (ROUTINE TESTING W REFLEX): HIV 1&2 Ab, 4th Generation: NONREACTIVE

## 2014-08-07 LAB — HEPATITIS C ANTIBODY: HCV Ab: NEGATIVE

## 2014-08-07 LAB — TSH: TSH: 2.354 u[IU]/mL (ref 0.350–4.500)

## 2014-08-07 LAB — HEPATITIS PANEL, ACUTE
HCV Ab: NEGATIVE
Hep A IgM: NONREACTIVE
Hep B C IgM: NONREACTIVE
Hepatitis B Surface Ag: NEGATIVE

## 2014-08-07 LAB — LIPID PANEL
CHOL/HDL RATIO: 3.3 ratio
Cholesterol: 154 mg/dL (ref 0–200)
HDL: 47 mg/dL (ref >39)
LDL Cholesterol: 97 mg/dL (ref 0–99)
Triglycerides: 50 mg/dL (ref <150)
VLDL: 10 mg/dL (ref 0–40)

## 2014-08-07 LAB — CYTOLOGY - PAP

## 2014-08-07 LAB — RPR

## 2014-08-13 ENCOUNTER — Telehealth: Payer: Self-pay | Admitting: *Deleted

## 2014-08-13 MED ORDER — AZITHROMYCIN 500 MG PO TABS: 500.0000 mg | ORAL_TABLET | Freq: Every day | ORAL | Status: DC

## 2014-08-13 NOTE — Telephone Encounter (Addendum)
-----   Message from Allie BossierMyra C Dove, MD sent at 08/12/2014  4:27 PM EST ----- She has CT and she and partner will need zithromax 1 gram po once. Condoms until TOC next month. Thanks  Spoke with patient and she will be sure to notify her partner to be treated.

## 2014-11-01 ENCOUNTER — Ambulatory Visit: Payer: BC Managed Care – PPO | Admitting: Obstetrics & Gynecology

## 2014-11-04 ENCOUNTER — Other Ambulatory Visit (INDEPENDENT_AMBULATORY_CARE_PROVIDER_SITE_OTHER): Payer: BC Managed Care – PPO | Admitting: *Deleted

## 2014-11-04 DIAGNOSIS — Z113 Encounter for screening for infections with a predominantly sexual mode of transmission: Secondary | ICD-10-CM

## 2014-11-04 DIAGNOSIS — Z7251 High risk heterosexual behavior: Secondary | ICD-10-CM

## 2014-11-05 LAB — GC/CHLAMYDIA PROBE AMP, URINE
CHLAMYDIA, SWAB/URINE, PCR: NEGATIVE
GC PROBE AMP, URINE: NEGATIVE

## 2014-11-06 ENCOUNTER — Ambulatory Visit: Payer: BC Managed Care – PPO | Admitting: Obstetrics and Gynecology

## 2015-02-28 ENCOUNTER — Ambulatory Visit (INDEPENDENT_AMBULATORY_CARE_PROVIDER_SITE_OTHER): Payer: BC Managed Care – PPO | Admitting: Family Medicine

## 2015-02-28 ENCOUNTER — Encounter: Payer: Self-pay | Admitting: Family Medicine

## 2015-02-28 VITALS — BP 137/80 | HR 79 | Wt 260.0 lb

## 2015-02-28 DIAGNOSIS — Z3049 Encounter for surveillance of other contraceptives: Secondary | ICD-10-CM

## 2015-02-28 DIAGNOSIS — Z3009 Encounter for other general counseling and advice on contraception: Secondary | ICD-10-CM

## 2015-02-28 DIAGNOSIS — Z3046 Encounter for surveillance of implantable subdermal contraceptive: Secondary | ICD-10-CM

## 2015-02-28 NOTE — Patient Instructions (Signed)
Contraception Choices Contraception (birth control) is the use of any methods or devices to prevent pregnancy. Below are some methods to help avoid pregnancy. HORMONAL METHODS   Contraceptive implant. This is a thin, plastic tube containing progesterone hormone. It does not contain estrogen hormone. Your health care provider inserts the tube in the inner part of the upper arm. The tube can remain in place for up to 3 years. After 3 years, the implant must be removed. The implant prevents the ovaries from releasing an egg (ovulation), thickens the cervical mucus to prevent sperm from entering the uterus, and thins the lining of the inside of the uterus.  Progesterone-only injections. These injections are given every 3 months by your health care provider to prevent pregnancy. This synthetic progesterone hormone stops the ovaries from releasing eggs. It also thickens cervical mucus and changes the uterine lining. This makes it harder for sperm to survive in the uterus.  Birth control pills. These pills contain estrogen and progesterone hormone. They work by preventing the ovaries from releasing eggs (ovulation). They also cause the cervical mucus to thicken, preventing the sperm from entering the uterus. Birth control pills are prescribed by a health care provider.Birth control pills can also be used to treat heavy periods.  Minipill. This type of birth control pill contains only the progesterone hormone. They are taken every day of each month and must be prescribed by your health care provider.  Birth control patch. The patch contains hormones similar to those in birth control pills. It must be changed once a week and is prescribed by a health care provider.  Vaginal ring. The ring contains hormones similar to those in birth control pills. It is left in the vagina for 3 weeks, removed for 1 week, and then a new one is put back in place. The patient must be comfortable inserting and removing the ring  from the vagina.A health care provider's prescription is necessary.  Emergency contraception. Emergency contraceptives prevent pregnancy after unprotected sexual intercourse. This pill can be taken right after sex or up to 5 days after unprotected sex. It is most effective the sooner you take the pills after having sexual intercourse. Most emergency contraceptive pills are available without a prescription. Check with your pharmacist. Do not use emergency contraception as your only form of birth control. BARRIER METHODS   Female condom. This is a thin sheath (latex or rubber) that is worn over the penis during sexual intercourse. It can be used with spermicide to increase effectiveness.  Female condom. This is a soft, loose-fitting sheath that is put into the vagina before sexual intercourse.  Diaphragm. This is a soft, latex, dome-shaped barrier that must be fitted by a health care provider. It is inserted into the vagina, along with a spermicidal jelly. It is inserted before intercourse. The diaphragm should be left in the vagina for 6 to 8 hours after intercourse.  Cervical cap. This is a round, soft, latex or plastic cup that fits over the cervix and must be fitted by a health care provider. The cap can be left in place for up to 48 hours after intercourse.  Sponge. This is a soft, circular piece of polyurethane foam. The sponge has spermicide in it. It is inserted into the vagina after wetting it and before sexual intercourse.  Spermicides. These are chemicals that kill or block sperm from entering the cervix and uterus. They come in the form of creams, jellies, suppositories, foam, or tablets. They do not require a   prescription. They are inserted into the vagina with an applicator before having sexual intercourse. The process must be repeated every time you have sexual intercourse. INTRAUTERINE CONTRACEPTION  Intrauterine device (IUD). This is a T-shaped device that is put in a woman's uterus  during a menstrual period to prevent pregnancy. There are 2 types:  Copper IUD. This type of IUD is wrapped in copper wire and is placed inside the uterus. Copper makes the uterus and fallopian tubes produce a fluid that kills sperm. It can stay in place for 10 years.  Hormone IUD. This type of IUD contains the hormone progestin (synthetic progesterone). The hormone thickens the cervical mucus and prevents sperm from entering the uterus, and it also thins the uterine lining to prevent implantation of a fertilized egg. The hormone can weaken or kill the sperm that get into the uterus. It can stay in place for 3-5 years, depending on which type of IUD is used. PERMANENT METHODS OF CONTRACEPTION  Female tubal ligation. This is when the woman's fallopian tubes are surgically sealed, tied, or blocked to prevent the egg from traveling to the uterus.  Hysteroscopic sterilization. This involves placing a small coil or insert into each fallopian tube. Your doctor uses a technique called hysteroscopy to do the procedure. The device causes scar tissue to form. This results in permanent blockage of the fallopian tubes, so the sperm cannot fertilize the egg. It takes about 3 months after the procedure for the tubes to become blocked. You must use another form of birth control for these 3 months.  Female sterilization. This is when the female has the tubes that carry sperm tied off (vasectomy).This blocks sperm from entering the vagina during sexual intercourse. After the procedure, the man can still ejaculate fluid (semen). NATURAL PLANNING METHODS  Natural family planning. This is not having sexual intercourse or using a barrier method (condom, diaphragm, cervical cap) on days the woman could become pregnant.  Calendar method. This is keeping track of the length of each menstrual cycle and identifying when you are fertile.  Ovulation method. This is avoiding sexual intercourse during ovulation.  Symptothermal  method. This is avoiding sexual intercourse during ovulation, using a thermometer and ovulation symptoms.  Post-ovulation method. This is timing sexual intercourse after you have ovulated. Regardless of which type or method of contraception you choose, it is important that you use condoms to protect against the transmission of sexually transmitted infections (STIs). Talk with your health care provider about which form of contraception is most appropriate for you. Document Released: 09/27/2005 Document Revised: 10/02/2013 Document Reviewed: 03/22/2013 ExitCare Patient Information 2015 [employer]. This information is not intended to replace advice given to you by your health care provider. Make sure you discuss any questions you have with your health care provider.  

## 2015-02-28 NOTE — Progress Notes (Signed)
    Subjective:    Patient ID: Sonya Schwartz is a 24 y.o. female presenting with Contraception  on 02/28/2015  HPI: Desires Nexplanon removal.  Does not want any further contraception, as she wants to give her body a break.  May want a new one put in in 2 months.  Review of Systems  Constitutional: Negative for fever and chills.  Respiratory: Negative for shortness of breath.   Cardiovascular: Negative for chest pain.  Gastrointestinal: Negative for nausea, vomiting and abdominal pain.  Genitourinary: Negative for dysuria.  Skin: Negative for rash.      Objective:    BP 137/80 mmHg  Pulse 79  Wt 260 lb (117.935 kg) Physical Exam  Constitutional: She is oriented to person, place, and time. She appears well-developed and well-nourished. No distress.  HENT:  Head: Normocephalic and atraumatic.  Eyes: No scleral icterus.  Neck: Neck supple.  Cardiovascular: Normal rate.   Pulmonary/Chest: Effort normal.  Abdominal: Soft.  Neurological: She is alert and oriented to person, place, and time.  Skin: Skin is warm and dry.  Psychiatric: She has a normal mood and affect.    Patient given informed consent for removal of her Implanon, time out was performed.  Signed copy in the chart.  Appropriate time out taken. Nexplanon site identified.  Area prepped in usual sterile fashon. One cc of 1% lidocaine was used to anesthetize the area at the distal end of the implant. A small stab incision was made right beside the implant on the distal portion.  The Nexplanon rod was grasped using hemostats and removed without difficulty.  There was less than 3 cc blood loss. There were no complications.  A small amount of antibiotic ointment and steri-strips were applied over the small incision.  A pressure bandage was applied to reduce any bruising.  The patient tolerated the procedure well and was given post procedure instructions.     Assessment & Plan:   Problem List Items Addressed This Visit     None    Visit Diagnoses    Implanon removal    -  Primary    Encounter for other general counseling or advice on contraception        Options given, if she would like to get Nexplanon placed again, she will call. Condoms for contraception for now.       Return if symptoms worsen or fail to improve.  Martha Soltys S 02/28/2015 11:04 AM

## 2015-04-20 ENCOUNTER — Emergency Department (HOSPITAL_COMMUNITY)
Admission: EM | Admit: 2015-04-20 | Discharge: 2015-04-20 | Disposition: A | Discharge status: Home / Self Care | Payer: BC Managed Care – PPO | Attending: Emergency Medicine | Admitting: Emergency Medicine

## 2015-04-20 ENCOUNTER — Encounter (HOSPITAL_COMMUNITY): Payer: Self-pay | Admitting: Emergency Medicine

## 2015-04-20 ENCOUNTER — Emergency Department (HOSPITAL_COMMUNITY): Payer: BC Managed Care – PPO

## 2015-04-20 DIAGNOSIS — Z8619 Personal history of other infectious and parasitic diseases: Secondary | ICD-10-CM | POA: Diagnosis not present

## 2015-04-20 DIAGNOSIS — Y9389 Activity, other specified: Secondary | ICD-10-CM | POA: Insufficient documentation

## 2015-04-20 DIAGNOSIS — S199XXA Unspecified injury of neck, initial encounter: Secondary | ICD-10-CM | POA: Diagnosis not present

## 2015-04-20 DIAGNOSIS — T22151A Burn of first degree of right shoulder, initial encounter: Secondary | ICD-10-CM | POA: Insufficient documentation

## 2015-04-20 DIAGNOSIS — Y998 Other external cause status: Secondary | ICD-10-CM | POA: Diagnosis not present

## 2015-04-20 DIAGNOSIS — S99911A Unspecified injury of right ankle, initial encounter: Secondary | ICD-10-CM | POA: Insufficient documentation

## 2015-04-20 DIAGNOSIS — Y92410 Unspecified street and highway as the place of occurrence of the external cause: Secondary | ICD-10-CM | POA: Diagnosis not present

## 2015-04-20 DIAGNOSIS — S8991XA Unspecified injury of right lower leg, initial encounter: Secondary | ICD-10-CM | POA: Insufficient documentation

## 2015-04-20 DIAGNOSIS — R000 Tachycardia, unspecified: Secondary | ICD-10-CM | POA: Diagnosis not present

## 2015-04-20 DIAGNOSIS — S4991XA Unspecified injury of right shoulder and upper arm, initial encounter: Secondary | ICD-10-CM | POA: Insufficient documentation

## 2015-04-20 DIAGNOSIS — T2112XA Burn of first degree of abdominal wall, initial encounter: Secondary | ICD-10-CM | POA: Insufficient documentation

## 2015-04-20 DIAGNOSIS — T2111XA Burn of first degree of chest wall, initial encounter: Secondary | ICD-10-CM | POA: Insufficient documentation

## 2015-04-20 DIAGNOSIS — M549 Dorsalgia, unspecified: Secondary | ICD-10-CM | POA: Insufficient documentation

## 2015-04-20 MED ORDER — HYDROCODONE-ACETAMINOPHEN 5-325 MG PO TABS
2.0000 | ORAL_TABLET | Freq: Once | ORAL | Status: AC
Start: 2015-04-20 — End: 2015-04-20
  Administered 2015-04-20: 2 via ORAL
  Filled 2015-04-20: qty 2

## 2015-04-20 MED ORDER — BACITRACIN 500 UNIT/GM EX OINT
1.0000 "application " | TOPICAL_OINTMENT | Freq: Two times a day (BID) | CUTANEOUS | Status: DC
  Administered 2015-04-20: 1 via TOPICAL

## 2015-04-20 MED ORDER — HYDROCODONE-ACETAMINOPHEN 5-325 MG PO TABS: 1.0000 | ORAL_TABLET | Freq: Four times a day (QID) | ORAL | Status: DC | PRN
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
## 2015-04-20 NOTE — Discharge Instructions (Signed)

## 2015-04-20 NOTE — ED Notes (Signed)
Pt ambulated in hal unassisted ,tol. Well.

## 2015-04-20 NOTE — ED Notes (Signed)
Declined W/C at D/C and was escorted to lobby by RN. 
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
## 2015-04-20 NOTE — ED Notes (Signed)
Pt transported to room T9 via W/C.

## 2015-04-29 ENCOUNTER — Encounter: Payer: Self-pay | Admitting: Primary Care

## 2015-04-29 ENCOUNTER — Ambulatory Visit (INDEPENDENT_AMBULATORY_CARE_PROVIDER_SITE_OTHER): Payer: BC Managed Care – PPO | Admitting: Primary Care

## 2015-04-29 VITALS — BP 126/76 | HR 80 | Temp 97.4°F | Ht 60.0 in | Wt 260.8 lb

## 2015-04-29 DIAGNOSIS — T300 Burn of unspecified body region, unspecified degree: Secondary | ICD-10-CM

## 2015-04-29 MED ORDER — SILVER SULFADIAZINE 1 % EX CREA
1.0000 | TOPICAL_CREAM | Freq: Two times a day (BID) | CUTANEOUS | Status: DC
Start: 2015-04-29 — End: 2015-07-17

## 2015-04-29 NOTE — Patient Instructions (Signed)
Apply cream twice daily to skin. Continue to keep covered and protected.  Wear brace to your ankle when on your feet for a prolonged amount of time. You may continue to take ibuprofen for pain.  Please schedule a physical with me in the next 2-3 months. You will also schedule a lab only appointment one week prior. We will discuss your lab results during your physical.  It was a pleasure to meet you today! Please don't hesitate to call me with any questions. Welcome to [facility]!
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
## 2015-04-29 NOTE — Progress Notes (Signed)
Pre visit review using our clinic review tool, if applicable. No additional management support is needed unless otherwise documented below in the visit note. 
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
## 2015-05-23 ENCOUNTER — Ambulatory Visit: Payer: BC Managed Care – PPO | Admitting: Primary Care

## 2015-05-28 ENCOUNTER — Ambulatory Visit (INDEPENDENT_AMBULATORY_CARE_PROVIDER_SITE_OTHER): Payer: BC Managed Care – PPO | Admitting: Obstetrics & Gynecology

## 2015-05-28 ENCOUNTER — Encounter: Payer: Self-pay | Admitting: Obstetrics & Gynecology

## 2015-05-28 VITALS — BP 113/83 | HR 66 | Resp 20 | Wt 258.0 lb

## 2015-05-28 DIAGNOSIS — Z8619 Personal history of other infectious and parasitic diseases: Secondary | ICD-10-CM

## 2015-05-28 DIAGNOSIS — Z113 Encounter for screening for infections with a predominantly sexual mode of transmission: Secondary | ICD-10-CM | POA: Diagnosis not present

## 2015-05-28 DIAGNOSIS — Z01812 Encounter for preprocedural laboratory examination: Secondary | ICD-10-CM | POA: Diagnosis not present

## 2015-05-28 DIAGNOSIS — Z30018 Encounter for initial prescription of other contraceptives: Secondary | ICD-10-CM | POA: Diagnosis not present

## 2015-05-28 DIAGNOSIS — Z30013 Encounter for initial prescription of injectable contraceptive: Secondary | ICD-10-CM

## 2015-05-28 LAB — POCT URINE PREGNANCY: Preg Test, Ur: NEGATIVE

## 2015-05-28 NOTE — Progress Notes (Signed)
   Subjective:    Patient ID: Sonya Schwartz, female    DOB: 1990-12-16, 24 y.o.   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 She had used Nexplanon for 3 years and had it removed a few months ago. But when her heavy periods returned, she decided to restart Nexplanon. She used condoms for the last 2 months.   Review of Systems     Objective:   Physical Exam UPT was negative. Consent was signed. Time out procedure was done. Her left arm was prepped with betadine and infiltrated with 2 cc of 1% lidocaine. After adequate anesthesia was assured, the Nexplanon device was placed according to standard of care. Her arm was hemostatic and was bandaged. She tolerated the procedure well.        Assessment & Plan:   Contraception- Nexplanon- rec back up method for a month H/o +CT - TOC today.

## 2015-05-29 ENCOUNTER — Encounter: Payer: Self-pay | Admitting: *Deleted

## 2015-05-29 LAB — GC/CHLAMYDIA PROBE AMP
CT Probe RNA: NEGATIVE
GC Probe RNA: NEGATIVE

## 2015-07-17 ENCOUNTER — Ambulatory Visit (INDEPENDENT_AMBULATORY_CARE_PROVIDER_SITE_OTHER)
Admission: RE | Admit: 2015-07-17 | Discharge: 2015-07-17 | Disposition: A | Discharge status: Home / Self Care | Payer: BC Managed Care – PPO | Source: Ambulatory Visit | Attending: Primary Care | Admitting: Primary Care

## 2015-07-17 ENCOUNTER — Ambulatory Visit (INDEPENDENT_AMBULATORY_CARE_PROVIDER_SITE_OTHER): Payer: BC Managed Care – PPO | Admitting: Primary Care

## 2015-07-17 ENCOUNTER — Encounter: Payer: Self-pay | Admitting: Primary Care

## 2015-07-17 VITALS — BP 128/70 | HR 70 | Temp 98.0°F | Ht 60.0 in | Wt 252.1 lb

## 2015-07-17 DIAGNOSIS — G8911 Acute pain due to trauma: Secondary | ICD-10-CM

## 2015-07-17 DIAGNOSIS — M545 Low back pain: Secondary | ICD-10-CM

## 2015-07-17 NOTE — Patient Instructions (Signed)
Complete xray(s) prior to leaving today. I will contact you regarding your results.  You may take 1 of the hydrocodone tablets you have at home every 6 hours as needed for back pain. Caution as this will make you drowsy.  You may take ibuprofen 800 mg three times daily as needed for mild to moderate pain.  You will be sore over the next 3-4 weeks. You may try applying heat for comfort. Please notify me if you've had no improvement in back pain after 4 weeks.  It was a pleasure to see you today!

## 2015-07-17 NOTE — Progress Notes (Signed)
Pre visit review using our clinic review tool, if applicable. No additional management support is needed unless otherwise documented below in the visit note. 
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
## 2015-08-11 ENCOUNTER — Ambulatory Visit (INDEPENDENT_AMBULATORY_CARE_PROVIDER_SITE_OTHER): Payer: BC Managed Care – PPO | Admitting: Primary Care

## 2015-08-11 ENCOUNTER — Encounter: Payer: Self-pay | Admitting: Primary Care

## 2015-08-11 VITALS — BP 120/72 | HR 63 | Temp 98.0°F | Ht 60.0 in | Wt 254.0 lb

## 2015-08-11 DIAGNOSIS — M25562 Pain in left knee: Secondary | ICD-10-CM | POA: Diagnosis not present
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
## 2015-08-11 NOTE — Patient Instructions (Signed)
Wear a brace/support sleeve to your knee when on your feet for the next 1-2 weeks. Elevate and rest your knee in the evening.  You may continue using the Voltaren gel as discussed. You may continue taking tylenol as discussed. Do not exceed 3000 mg in one day.  Please notify me if no improvement in pain in the next 2-3 weeks.  It was a pleasure to see you today!  Knee Pain Knee pain is a very common symptom and can have many causes. Knee pain often goes away when you follow your health care provider's instructions for relieving pain and discomfort at home. However, knee pain can develop into a condition that needs treatment. Some conditions may include:  Arthritis caused by wear and tear (osteoarthritis).  Arthritis caused by swelling and irritation (rheumatoid arthritis or gout).  A cyst or growth in your knee.  An infection in your knee joint.  An injury that will not heal.  Damage, swelling, or irritation of the tissues that support your knee (torn ligaments or tendinitis). If your knee pain continues, additional tests may be ordered to diagnose your condition. Tests may include X-rays or other imaging studies of your knee. You may also need to have fluid removed from your knee. Treatment for ongoing knee pain depends on the cause, but treatment may include:  Medicines to relieve pain or swelling.  Steroid injections in your knee.  Physical therapy.  Surgery. HOME CARE INSTRUCTIONS  Take medicines only as directed by your health care provider.  Rest your knee and keep it raised (elevated) while you are resting.  Do not do things that cause or worsen pain.  Avoid high-impact activities or exercises, such as running, jumping rope, or doing jumping jacks.  Apply ice to the knee area:  Put ice in a plastic bag.  Place a towel between your skin and the bag.  Leave the ice on for 20 minutes, 2-3 times a day.  Ask your health care provider if you should wear an elastic  knee support.  Keep a pillow under your knee when you sleep.  Lose weight if you are overweight. Extra weight can put pressure on your knee.  Do not use any tobacco products, including cigarettes, chewing tobacco, or electronic cigarettes. If you need help quitting, ask your health care provider. Smoking may slow the healing of any bone and joint problems that you may have. SEEK MEDICAL CARE IF:  Your knee pain continues, changes, or gets worse.  You have a fever along with knee pain.  Your knee buckles or locks up.  Your knee becomes more swollen. SEEK IMMEDIATE MEDICAL CARE IF:   Your knee joint feels hot to the touch.  You have chest pain or trouble breathing.   This information is not intended to replace advice given to you by your health care provider. Make sure you discuss any questions you have with your health care provider.   Document Released: 07/25/2007 Document Revised: 10/18/2014 Document Reviewed: 05/13/2014 Elsevier Interactive Patient Education [employer]2016 Elsevier Inc.

## 2015-08-11 NOTE — Progress Notes (Signed)
Pre visit review using our clinic review tool, if applicable. No additional management support is needed unless otherwise documented below in the visit note. 

## 2016-02-18 ENCOUNTER — Ambulatory Visit: Payer: BC Managed Care – PPO | Admitting: Certified Nurse Midwife

## 2016-02-23 ENCOUNTER — Telehealth: Payer: Self-pay | Admitting: Primary Care

## 2016-02-23 NOTE — Telephone Encounter (Signed)
Patient Name: Kipp LaurenceLMBREIA BRAILSFOR D DOB: Apr 11, 1991 Initial Comment Caller states she's having sharp pain in her right eye. She has to keep her eyes closed. Some pressure. Nurse Assessment Nurse: Yetta BarreJones, RN, Miranda Date/Time (Eastern Time): 02/23/2016 1:13:52 PM Confirm and document reason for call. If symptomatic, describe symptoms. You must click the next button to save text entered. ---Caller states she has had a sharp pain and pressure in her right eye off and on since Saturday. No known trauma. Has the patient traveled out of the country within the last 30 days? ---Not Applicable Does the patient have any new or worsening symptoms? ---Yes Will a triage be completed? ---Yes Related visit to physician within the last 2 weeks? ---No Does the PT have any chronic conditions? (i.e. diabetes, asthma, etc.) ---No Is the patient pregnant or possibly pregnant? (Ask all females between the ages of 9112-55) ---No Is this a behavioral health or substance abuse call? ---No Guidelines Guideline Title Affirmed Question Affirmed Notes Eye Pain SEVERE eye pain Final Disposition User Go to ED Now Yetta BarreJones, RN, Miranda Referrals [facility] Disagree/Comply: Comply

## 2016-02-24 NOTE — Telephone Encounter (Signed)
Will you please check on Sonya Schwartz? Is she feeling better? Was she evaluated anywhere?

## 2016-02-24 NOTE — Telephone Encounter (Signed)
Left message to return call 

## 2016-02-24 NOTE — Telephone Encounter (Signed)
Patient have appt with Jae DireKate on 02/25/16.

## 2016-02-25 ENCOUNTER — Encounter: Payer: Self-pay | Admitting: Primary Care

## 2016-02-25 ENCOUNTER — Ambulatory Visit (INDEPENDENT_AMBULATORY_CARE_PROVIDER_SITE_OTHER): Payer: BC Managed Care – PPO | Admitting: Primary Care

## 2016-02-25 VITALS — BP 122/74 | HR 70 | Temp 98.0°F | Ht 60.0 in | Wt 252.8 lb

## 2016-02-25 DIAGNOSIS — G43001 Migraine without aura, not intractable, with status migrainosus: Secondary | ICD-10-CM | POA: Diagnosis not present

## 2016-02-25 MED ORDER — SUMATRIPTAN SUCCINATE 25 MG PO TABS: ORAL_TABLET | ORAL | Status: DC

## 2016-02-25 MED ORDER — KETOROLAC TROMETHAMINE 60 MG/2ML IM SOLN
60.0000 mg | Freq: Once | INTRAMUSCULAR | Status: AC
  Administered 2016-02-25: 60 mg via INTRAMUSCULAR

## 2016-02-25 NOTE — Patient Instructions (Signed)
You've been provided with an injection of Toradol 60 mg for your headache.  I've sent a prescription for Imitrex (Sumatriptain). This is an abortive medication that is only to be used if no improvement with ibuprofen or excedrin migraine.  Take 1 tablet at onset of migraine. May repeat in 2 hours if migraine persists. Do not exceed 2 tablets in 24 hours. This medication may cause drowsiness.   If you experience a headache, take ibuprofen 600 mg three times daily as needed.  Please call me if no improvement with Imitrex. Also notify me if your headaches start to become daily.  It was a pleasure to see you today!  Migraine Headache A migraine headache is an intense, throbbing pain on one or both sides of your head. A migraine can last for 30 minutes to several hours. CAUSES  The exact cause of a migraine headache is not always known. However, a migraine may be caused when nerves in the brain become irritated and release chemicals that cause inflammation. This causes pain. Certain things may also trigger migraines, such as:  Alcohol.  Smoking.  Stress.  Menstruation.  Aged cheeses.  Foods or drinks that contain nitrates, glutamate, aspartame, or tyramine.  Lack of sleep.  Chocolate.  Caffeine.  Hunger.  Physical exertion.  Fatigue.  Medicines used to treat chest pain (nitroglycerine), birth control pills, estrogen, and some blood pressure medicines. SIGNS AND SYMPTOMS  Pain on one or both sides of your head.  Pulsating or throbbing pain.  Severe pain that prevents daily activities.  Pain that is aggravated by any physical activity.  Nausea, vomiting, or both.  Dizziness.  Pain with exposure to bright lights, loud noises, or activity.  General sensitivity to bright lights, loud noises, or smells. Before you get a migraine, you may get warning signs that a migraine is coming (aura). An aura may include:  Seeing flashing lights.  Seeing bright spots, halos,  or zigzag lines.  Having tunnel vision or blurred vision.  Having feelings of numbness or tingling.  Having trouble talking.  Having muscle weakness. DIAGNOSIS  A migraine headache is often diagnosed based on:  Symptoms.  Physical exam.  A CT scan or MRI of your head. These imaging tests cannot diagnose migraines, but they can help rule out other causes of headaches. TREATMENT Medicines may be given for pain and nausea. Medicines can also be given to help prevent recurrent migraines.  HOME CARE INSTRUCTIONS  Only take over-the-counter or prescription medicines for pain or discomfort as directed by your health care provider. The use of long-term narcotics is not recommended.  Lie down in a dark, quiet room when you have a migraine.  Keep a journal to find out what may trigger your migraine headaches. For example, write down:  What you eat and drink.  How much sleep you get.  Any change to your diet or medicines.  Limit alcohol consumption.  Quit smoking if you smoke.  Get 7-9 hours of sleep, or as recommended by your health care provider.  Limit stress.  Keep lights dim if bright lights bother you and make your migraines worse. SEEK IMMEDIATE MEDICAL CARE IF:   Your migraine becomes severe.  You have a fever.  You have a stiff neck.  You have vision loss.  You have muscular weakness or loss of muscle control.  You start losing your balance or have trouble walking.  You feel faint or pass out.  You have severe symptoms that are different from your  first symptoms. MAKE SURE YOU:   Understand these instructions.  Will watch your condition.  Will get help right away if you are not doing well or get worse.   This information is not intended to replace advice given to you by your health care provider. Make sure you discuss any questions you have with your health care provider.   Document Released: 09/27/2005 Document Revised: 10/18/2014 Document Reviewed:  06/04/2013 Elsevier Interactive Patient Education [employer]2016 Elsevier Inc.

## 2016-02-25 NOTE — Progress Notes (Signed)
Pre visit review using our clinic review tool, if applicable. No additional management support is needed unless otherwise documented below in the visit note. 

## 2016-02-25 NOTE — Progress Notes (Signed)
Subjective:    Patient ID: Sonya Schwartz, female    DOB: Aug 22, 1991, 25 y.o.   MRN: [id number]  HPI  Ms. Sonya Schwartz is a 25 year old female who presents today with a chief complaint of migraines. [facility]. Her migraine began Saturday last weekend and was located behind her right eye. Her pain persisted so she presented for further evaluation. She had photophobia, phonophobia. Denies nausea, vomiting, aura, visual changes.   She was provided with an injection of Trexim[facility] at [facility] as well as a prescription for Fioricet for which she never received. Her migraine was relieved by the end of her [facility].   Her migraine returned yesterday and is currently located behind her right eye. The pain has improved today in comparison to yesterday. Prior to Saturday she's never experienced a migraine. She tried tylenol without improvement for her migraine last weekend. She's taken nothing today. She denies any stress or changes in her life to trigger migraines.  Review of Systems  Eyes: Negative for photophobia and visual disturbance.  Cardiovascular: Negative for chest pain.  Gastrointestinal: Negative for nausea and vomiting.  Neurological: Positive for headaches. Negative for dizziness.       Past Medical History  Diagnosis Date  . Infection 2011    CHLAMYDIA  . Morbid obesity with BMI of 45.0-49.9, adult [facility](HCC)      Social History   Social History  . Marital Status: Single    Spouse Name: N/A  . Number of Children: N/A  . Years of Education: N/A   Occupational History  . Not on file.   Social History Main Topics  . Smoking status: Never Smoker   . Smokeless tobacco: Never Used  . Alcohol Use: Yes     Comment: rare  . Drug Use: No  . Sexual Activity:    Partners: Male    Birth Control/ Protection: Implant   Other Topics Concern  . Not on file   Social History Narrative   Single.   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    Enjoys listening to music, being with friends.    Past Surgical History  Procedure Laterality Date  . Wisdom tooth extraction      x 4  . Vagina surgery      at age 89. for menstrual flow.  . Hymenectomy      Family History  Problem Relation Age of Onset  . Hypertension Mother   . Heart disease Mother   . Arthritis Mother   . Bronchiolitis Brother   . Bronchiolitis Sister     No Known Allergies  Current Outpatient Prescriptions on File Prior to Visit  Medication Sig Dispense Refill  . etonogestrel (NEXPLANON) 68 MG IMPL implant 1 each by Subdermal route once.     No current facility-administered medications on file prior to visit.    BP 122/74 mmHg  Pulse 70  Temp(Src) 98 F (36.7 C) (Oral)  Ht 5' (1.524 m)  Wt 252 lb 12.8 oz (114.669 kg)  BMI 49.37 kg/m2  SpO2 96%    Objective:   Physical Exam  Constitutional: She appears well-nourished.  Eyes: Pupils are equal, round, and reactive to light.  Cardiovascular: Normal rate and regular rhythm.   Pulmonary/Chest: Effort normal and breath sounds normal.  Neurological: No cranial nerve deficit.  Skin: Skin is warm and dry.  Assessment & Plan:

## 2016-02-25 NOTE — Assessment & Plan Note (Signed)
No prior history. Migraine occurred Saturday last week and again yesterday. Located behind right eye, likely cluster headache.  Will treat residual migraine today with IM Toradol. Abortive treatment sent to pharmacy with detailed instructions on how to take. She will start with ibuprofen first, then excedrin migraine, then Imitrex.  She is to notify me if migraines return, if she develops daily headaches, dizziness, visual changes.

## 2016-02-26 ENCOUNTER — Ambulatory Visit: Payer: BC Managed Care – PPO | Admitting: Obstetrics & Gynecology

## 2016-02-26 DIAGNOSIS — Z01419 Encounter for gynecological examination (general) (routine) without abnormal findings: Secondary | ICD-10-CM

## 2016-04-09 ENCOUNTER — Telehealth: Payer: Self-pay | Admitting: Primary Care

## 2016-04-09 NOTE — Telephone Encounter (Signed)
Noted and agree with plan.

## 2016-04-09 NOTE — Telephone Encounter (Signed)
PLEASE NOTE: All timestamps contained within this report are represented as [place]. CONFIDENTIALTY NOTICE: This fax transmission is intended only for the addressee. It contains information that is legally privileged, confidential or otherwise protected from use or disclosure. If you are not the intended recipient, you are strictly prohibited from reviewing, disclosing, copying using or disseminating any of this information or taking any action in reliance on or regarding this information. If you have received this fax in error, please notify us immediately by telephone so that we can arrange for its return to us. Phone: [phone], Toll-Free: [phone], Fax: [phone] Page: 1 of 2 Call Id: [id number] [place] [facility] - Client TELEPHONE ADVICE RECORD [facility] Patient Name: Sonya LaurenceLMBREIA BRAILSFOR D Gender: Female DOB: 09/10/1991 Age: [age] Y 11 M 14 D Return Phone Number: [phone] (Primary) Address: City/State/Zip: [place] Client [facility] - Client Client Site [facility] - Day Physician Sonya Schwartz, Sonya Schwartz Contact Type Call Who Is Calling Patient / Member / Family / Caregiver Call Type Triage / Clinical Caller Name Zachary Relationship To Patient Self Return Phone Number [phone] (Primary) Chief Complaint Vaginal Bleeding Reason for Call Symptomatic / Request for Health Information Initial Comment Caller states she's having vaginal bleeding. Appointment Disposition EMR Appointment Not Necessary Info pasted into Epic Yes PreDisposition Call Doctor Translation No Nurse Assessment Nurse: Sonya Schwartz Date/Time Lamount Cohen(Eastern Time): 04/09/2016 12:24:51 PM Confirm and document reason for call. If symptomatic, describe symptoms. You must click the next button to save text entered. ---Riki RuskAlmbreia states she has had unusual vaginal bleeding with mild abdominal cramping for the  past 2-4 weeks. No fever. No injury. Has the patient traveled out of the country within the last 30 days? ---No Does the patient have any new or worsening symptoms? ---Yes Will a triage be completed? ---Yes Related visit to physician within the last 2 weeks? ---No Does the PT have any chronic conditions? (i.e. diabetes, asthma, etc.) ---No Is the patient pregnant or possibly pregnant? (Ask all females between the ages of 2212-55) ---No Is this a behavioral health or substance abuse call? ---No Guidelines Guideline Title Affirmed Question Affirmed Notes Nurse Date/Time (Eastern Time) Vaginal Bleeding - Abnormal [1] Menstrual cycle < 21 days OR > 35 days AND [2] has occurred once this past year Sonya Schwartz 04/09/2016 12:26:47 PM PLEASE NOTE: All timestamps contained within this report are represented as [place]. CONFIDENTIALTY NOTICE: This fax transmission is intended only for the addressee. It contains information that is legally privileged, confidential or otherwise protected from use or disclosure. If you are not the intended recipient, you are strictly prohibited from reviewing, disclosing, copying using or disseminating any of this information or taking any action in reliance on or regarding this information. If you have received this fax in error, please notify us immediately by telephone so that we can arrange for its return to us. Phone: [phone], Toll-Free: [phone], Fax: [phone] Page: 2 of 2 Call Id: [id number] Disp. Time Lamount Cohen(Eastern Time) Disposition Final User 04/09/2016 11:55:27 AM Attempt made - message left Sonya Schwartz 04/09/2016 12:01:28 PM Attempt made - message left Sonya Schwartz 04/09/2016 12:31:14 PM Home Care Yes Trumbull, RN, Frann Riderathy Caller Understands: Yes Disagree/Comply: Comply Care Advice Given Per Guideline HOME CARE: You should be able to treat this at home. REASSURANCE: If you continue to have menstrual cycles that  are unusually short (under 21 days) or unusually long (over  35 days), then you should see a healthcare provider for an examination. PREGNANCY TEST, WHEN IN DOUBT: * If there is a chance that you might be pregnant, use a urine pregnancy test. * You can buy a pregnancy test at the drugstore. * It works best if you test your first urine in the morning. IRON AND ANEMIA: * Heavy periods are the most common cause of iron deficiency anemia in women of child-bearing age. * Women with heavy periods should eat a diet rich in iron or take a daily multivitamin pill with iron. CALL BACK IF: * Bleeding becomes heavy * Bleeding lasts over 7 days * You continue to have menstrual cycles that are unusually short (under 21 days) or unusually long (over 35 days) * You become worse. CARE ADVICE given per Vaginal Bleeding, Abnormal (Adult) guideline.

## 2016-04-09 NOTE — Telephone Encounter (Signed)
PLEASE NOTE: All timestamps contained within this report are represented as [place]. CONFIDENTIALTY NOTICE: This fax transmission is intended only for the addressee. It contains information that is legally privileged, confidential or otherwise protected from use or disclosure. If you are not the intended recipient, you are strictly prohibited from reviewing, disclosing, copying using or disseminating any of this information or taking any action in reliance on or regarding this information. If you have received this fax in error, please notify us immediately by telephone so that we can arrange for its return to us. Phone: [phone], Toll-Free: [phone], Fax: [phone] Page: 1 of 1 Call Id: [id number] [place] [facility] Client TELEPHONE ADVICE RECORD [facility] Patient Name: Sonya LaurenceLMBREIA BRAILSFOR D DOB: [birth year] Initial Comment Caller states she's having vaginal bleeding. Nurse Assessment Nurse: Charna Elizabethrumbull, RN, Cathy Date/Time (Eastern Time): 04/09/2016 12:24:51 PM Confirm and document reason for call. If symptomatic, describe symptoms. You must click the next button to save text entered. ---Riki RuskAlmbreia states she has had unusual vaginal bleeding with mild abdominal cramping for the past 2-4 weeks. No fever. No injury. Has the patient traveled out of the country within the last 30 days? ---No Does the patient have any new or worsening symptoms? ---Yes Will a triage be completed? ---Yes Related visit to physician within the last 2 weeks? ---No Does the PT have any chronic conditions? (i.e. diabetes, asthma, etc.) ---No Is the patient pregnant or possibly pregnant? (Ask all females between the ages of 5012-55) ---No Is this a behavioral health or substance abuse call? ---No Guidelines Guideline Title Affirmed Question Affirmed Notes Vaginal Bleeding - Abnormal [1] Menstrual cycle < 21 days OR > 35 days AND [2] has occurred once  this past year Final Disposition User Home Care Hustonvillerumbull, RN, Lynden Angathy Disagree/Comply: Comply

## 2016-05-04 ENCOUNTER — Encounter: Payer: Self-pay | Admitting: Primary Care

## 2016-05-04 ENCOUNTER — Ambulatory Visit (INDEPENDENT_AMBULATORY_CARE_PROVIDER_SITE_OTHER): Payer: BC Managed Care – PPO | Admitting: Primary Care

## 2016-05-04 VITALS — BP 114/70 | HR 86 | Temp 98.2°F | Ht 60.0 in | Wt 256.8 lb

## 2016-05-04 DIAGNOSIS — E669 Obesity, unspecified: Secondary | ICD-10-CM

## 2016-05-04 DIAGNOSIS — Z113 Encounter for screening for infections with a predominantly sexual mode of transmission: Secondary | ICD-10-CM

## 2016-05-04 DIAGNOSIS — G43001 Migraine without aura, not intractable, with status migrainosus: Secondary | ICD-10-CM

## 2016-05-04 DIAGNOSIS — Z0000 Encounter for general adult medical examination without abnormal findings: Secondary | ICD-10-CM | POA: Insufficient documentation

## 2016-05-04 LAB — COMPREHENSIVE METABOLIC PANEL
ALT: 11 U/L (ref 0–35)
AST: 17 U/L (ref 0–37)
Albumin: 3.8 g/dL (ref 3.5–5.2)
Alkaline Phosphatase: 51 U/L (ref 39–117)
BUN: 13 mg/dL (ref 6–23)
CHLORIDE: 104 meq/L (ref 96–112)
CO2: 29 mEq/L (ref 19–32)
Calcium: 9.3 mg/dL (ref 8.4–10.5)
Creatinine, Ser: 0.77 mg/dL (ref 0.40–1.20)
GFR: 117.44 mL/min (ref >60.00)
GLUCOSE: 93 mg/dL (ref 70–99)
POTASSIUM: 4 meq/L (ref 3.5–5.1)
Sodium: 137 mEq/L (ref 135–145)
TOTAL PROTEIN: 7 g/dL (ref 6.0–8.3)
Total Bilirubin: 0.5 mg/dL (ref 0.2–1.2)

## 2016-05-04 LAB — LIPID PANEL
Cholesterol: 154 mg/dL (ref 0–200)
HDL: 44.4 mg/dL (ref >39.00)
LDL CALC: 98 mg/dL (ref 0–99)
NONHDL: 109.78
Total CHOL/HDL Ratio: 3
Triglycerides: 59 mg/dL (ref 0.0–149.0)
VLDL: 11.8 mg/dL (ref 0.0–40.0)

## 2016-05-04 LAB — TSH: TSH: 2.25 u[IU]/mL (ref 0.35–4.50)

## 2016-05-04 LAB — HEMOGLOBIN A1C: HEMOGLOBIN A1C: 5.7 % (ref 4.6–6.5)

## 2016-05-04 NOTE — Patient Instructions (Signed)
Complete lab work prior to leaving today. I will notify you of your results once received.   It is important that you improve your diet. Please limit carbohydrates in the form of white bread, rice, pasta, fast food, sugary drinks, etc. Increase your consumption of fresh fruits and vegetables, whole grains, lean protein.  Ensure you are consuming 64 ounces of water daily.  Start exercising. You should be getting 1 hour of moderate intensity exercise 5 days weekly.  Follow up in 1 year for repeat physical or sooner if needed. We will complete your pap next year.  It was a pleasure to see you today! Have fun in [place]!

## 2016-05-04 NOTE — Assessment & Plan Note (Signed)
Immunizations UTD. Pap due in 2018.  Exam unremarkable. Labs pending. Discussed the importance of a healthy diet and regular exercise in order for weight loss and to reduce risk of other medical diseases. Follow up in 1 year for repeat physical.

## 2016-05-04 NOTE — Progress Notes (Signed)
Subjective:    Patient ID: Sonya Schwartz, female    DOB: Jan 18, 1991, 25 y.o.   MRN: [id number]  HPI  Sonya Schwartz is a 25 year old female who presents today for complete physical.  Immunizations: -Tetanus: Unsure, believes it's been within 10 years. -Influenza: Does not complete.  Diet: She denies a healthy diet. Breakfast: Skips Lunch: Left overs Dinner: BBQ chicken, ribs, fast food, no vegetables Snacks: None Desserts: Occasionally Beverages: Water, soda  Exercise: She does not currently exercise Eye exam: Completed in 2015 Dental exam: Completes annually  Pap Smear: Completed 2 years ago, normal.    Review of Systems  Constitutional: Negative for unexpected weight change.  HENT: Negative for rhinorrhea.   Respiratory: Negative for cough and shortness of breath.   Cardiovascular: Negative for chest pain.  Gastrointestinal: Negative for constipation and diarrhea.  Genitourinary: Negative for difficulty urinating.       Period in early June which was heavy, then returned 2 weeks later. Reinsertion of Nexplanon in Summer 2016.  Musculoskeletal: Negative for arthralgias and myalgias.  Skin: Negative for rash.  Allergic/Immunologic: Negative for environmental allergies.  Neurological: Negative for dizziness, numbness and headaches.       Headaches overall improved  Psychiatric/Behavioral:       Denies concerns for anxiety or depression       Past Medical History:  Diagnosis Date  . Infection 2011   CHLAMYDIA  . Morbid obesity with BMI of 45.0-49.9, adult [facility])      Social History   Social History  . Marital status: Single    Spouse name: N/A  . Number of children: N/A  . Years of education: N/A   Occupational History  . Not on file.   Social History Main Topics  . Smoking status: Never Smoker  . Smokeless tobacco: Never Used  . Alcohol use Yes     Comment: rare  . Drug use: No  . Sexual activity: Yes    Partners: Male    Birth control/  protection: Implant   Other Topics Concern  . Not on file   Social History Narrative   Single.   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    Enjoys listening to music, being with friends.    Past Surgical History:  Procedure Laterality Date  . HYMENECTOMY    . VAGINA SURGERY     at age 27. for menstrual flow.  . WISDOM TOOTH EXTRACTION     x 4    Family History  Problem Relation Age of Onset  . Hypertension Mother   . Heart disease Mother   . Arthritis Mother   . Bronchiolitis Brother   . Bronchiolitis Sister     No Known Allergies  Current Outpatient Prescriptions on File Prior to Visit  Medication Sig Dispense Refill  . etonogestrel (NEXPLANON) 68 MG IMPL implant 1 each by Subdermal route once.    . SUMAtriptan (IMITREX) 25 MG tablet Take 1 tablet by mouth at migraine onset. May repeat in 2 hours if migraine persists. Do not exceed 2 tablets in 24 hours. (Patient not taking: Reported on 05/04/2016) 10 tablet 0   No current facility-administered medications on file prior to visit.     BP 114/70 (BP Location: Right Arm, Patient Position: Sitting, Cuff Size: Large)   Pulse 86   Temp 98.2 F (36.8 C) (Oral)   Ht 5' (1.524 m)   Wt 256 lb 12.8 oz (  116.5 kg)   SpO2 98%   BMI 50.15 kg/m    Objective:   Physical Exam  Constitutional: She is oriented to person, place, and time. She appears well-nourished.  HENT:  Right Ear: Tympanic membrane and ear canal normal.  Left Ear: Tympanic membrane and ear canal normal.  Nose: Nose normal.  Mouth/Throat: Oropharynx is clear and moist.  Eyes: Conjunctivae and EOM are normal. Pupils are equal, round, and reactive to light.  Neck: Neck supple. No thyromegaly present.  Cardiovascular: Normal rate and regular rhythm.   No murmur heard. Pulmonary/Chest: Effort normal and breath sounds normal. She has no rales.  Abdominal: Soft. Bowel sounds are normal.  There is no tenderness.  Musculoskeletal: Normal range of motion.  Lymphadenopathy:    She has no cervical adenopathy.  Neurological: She is alert and oriented to person, place, and time. She has normal reflexes. No cranial nerve deficit.  Skin: Skin is warm and dry. No rash noted.  Psychiatric: She has a normal mood and affect.          Assessment & Plan:

## 2016-05-04 NOTE — Assessment & Plan Note (Signed)
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 Discussed to take breaks every hour to prevent onset of migraines.

## 2016-05-04 NOTE — Progress Notes (Signed)
Pre visit review using our clinic review tool, if applicable. No additional management support is needed unless otherwise documented below in the visit note. 

## 2016-05-04 NOTE — Assessment & Plan Note (Signed)
Discussed the importance of a healthy diet and regular exercise in order for weight loss and to reduce risk of other medical diseases. Information provided regarding changes in diet and exercise.

## 2016-05-04 NOTE — Addendum Note (Signed)
Addended by: Alvina Chou on: 05/04/2016 11:38 AM   Modules accepted: Orders

## 2016-05-05 LAB — GC/CHLAMYDIA PROBE AMP
CT Probe RNA: NOT DETECTED
GC Probe RNA: NOT DETECTED

## 2016-05-05 LAB — HIV ANTIBODY (ROUTINE TESTING W REFLEX): HIV 1&2 Ab, 4th Generation: NONREACTIVE

## 2016-05-06 LAB — RPR

## 2016-11-15 ENCOUNTER — Telehealth: Payer: Self-pay

## 2016-11-15 NOTE — Telephone Encounter (Signed)
Pt last annual 05/04/16; pt said she was told if problem with periods to contact [facility]. Pt has Nexplanon; no period since had Nexplanon until menstrual period from 10/21/16 - 10/25/16. Pt started period again on 11/13/16. Period appears to be like a normal regular period; this time the bleeding is bright red and no blood clots seen.  Pt request cb [facility].

## 2016-11-15 NOTE — Telephone Encounter (Signed)
The Nexplanon lasts for a maximum of three years, so she may need replacement. I do encourage her to allow another 1-2 months to see if the bleeding stops. Symptoms such as increased stress, fluctuations in weight, poor diet can alter hormones. Please also notify her that I don't manage Nexplanon so I'm not very familiar. I can set her up with GYN if she thinks she's having complications, otherwise I recommend she wait it out for another month.

## 2016-11-15 NOTE — Telephone Encounter (Signed)
Duplicate/error

## 2016-11-17 NOTE — Telephone Encounter (Signed)
Spoken and notified patient of Kate's comments. Patient verbalized understanding. 

## 2016-12-13 ENCOUNTER — Ambulatory Visit: Payer: BC Managed Care – PPO | Admitting: Primary Care

## 2016-12-14 ENCOUNTER — Ambulatory Visit: Payer: BC Managed Care – PPO | Admitting: Obstetrics and Gynecology

## 2016-12-22 ENCOUNTER — Encounter: Payer: Self-pay | Admitting: Obstetrics and Gynecology

## 2016-12-22 ENCOUNTER — Ambulatory Visit (INDEPENDENT_AMBULATORY_CARE_PROVIDER_SITE_OTHER): Payer: BC Managed Care – PPO | Admitting: Obstetrics and Gynecology

## 2016-12-22 VITALS — BP 121/74 | HR 61 | Resp 20 | Ht 60.0 in | Wt 259.0 lb

## 2016-12-22 DIAGNOSIS — Z01419 Encounter for gynecological examination (general) (routine) without abnormal findings: Secondary | ICD-10-CM

## 2016-12-22 DIAGNOSIS — Z6841 Body Mass Index (BMI) 40.0 and over, adult: Secondary | ICD-10-CM

## 2016-12-22 DIAGNOSIS — Z0000 Encounter for general adult medical examination without abnormal findings: Secondary | ICD-10-CM

## 2016-12-22 NOTE — Progress Notes (Signed)
Obstetrics and Gynecology Annual Patient Evaluation  Appointment Date: 12/22/2016  OBGYN Clinic: [facility]  Primary Care Provider: Morrie Sheldon   Chief Complaint:  Chief Complaint  Patient presents with  . Gynecologic Exam    History of Present Illness: Sonya Schwartz is a 26 y.o. African-American G0 (Patient's last menstrual period was 12/15/2016.), seen for the above chief complaint. Her past medical history is significant for BMI 50.  Patient doing well and only OBGYN problem/issue is that she started having every month, x 5-7d periods for the past 65m. She is on Nexplanon #3 and never had a period in previous months and is wondering about this.  They aren't particularly heavy or painful; she is using the nexplanon for both BC and to help with period control     No breast s/s, fevers, chills, chest pain, SOB, nausea, vomiting, abdominal pain, dysuria, hematuria, vaginal itching, dyspareunia, diarrhea, constipation, blood in BMs  Review of Systems: as noted in the History of Present Illness.  Past Medical History:  Past Medical History:  Diagnosis Date  . History of chlamydia   . Infection 2011   CHLAMYDIA  . Morbid obesity with BMI of 45.0-49.9, adult [facility])     Past Surgical History:  Past Surgical History:  Procedure Laterality Date  . HYMENECTOMY    . VAGINA SURGERY     at age 56. for menstrual flow.  . WISDOM TOOTH EXTRACTION     x 4    Past Obstetrical History:  OB History  Gravida Para Term Preterm AB Living  0 0 0 0 0 0  SAB TAB Ectopic Multiple Live Births  0 0 0 0         Past Gynecological History: As per HPI. Pap: negative 07/2014  Social History:  Social History   Social History  . Marital status: Single    Spouse name: N/A  . Number of children: N/A  . Years of education: N/A   Occupational History  . Not on file.   Social History Main Topics  . Smoking status: Never Smoker  . Smokeless tobacco:  Never Used  . Alcohol use Yes     Comment: rare  . Drug use: No  . Sexual activity: Yes    Partners: Male    Birth control/ protection: Implant   Other Topics Concern  . Not on file   Social History Narrative   Single.   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    Enjoys listening to music, being with friends.    Family History:  Family History  Problem Relation Age of Onset  . Hypertension Mother   . Heart disease Mother   . Arthritis Mother   . Bronchiolitis Brother   . Bronchiolitis Sister    She denies any female cancers, bleeding or blood clotting disorders.    Medications Ms. Candler had no medications administered during this visit. Current Outpatient Prescriptions  Medication Sig Dispense Refill  . etonogestrel (NEXPLANON) 68 MG IMPL implant 1 each by Subdermal route once.     No current facility-administered medications for this visit.     Allergies Patient has no known allergies.   Physical Exam:  BP 121/74 (BP Location: Left Arm, Patient Position: Sitting, Cuff Size: Large)   Pulse 61   Resp 20   Ht 5' (1.524 m)   Wt 259 lb (117.5 kg)   LMP 12/15/2016   BMI 50.58 kg/m  Body mass index is 50.58 kg/m. General appearance: Well nourished, well developed female in no acute distress.  Neck:  Supple, normal appearance, and no thyromegaly  Cardiovascular: normal s1 and s2.  No murmurs, rubs or gallops. Respiratory:  Clear to auscultation bilateral. Normal respiratory effort Abdomen: positive bowel sounds and no masses, hernias; diffusely non tender to palpation, non distended Breasts: breasts appear normal, no suspicious masses, no skin or nipple changes or axillary nodes, and normal inspection. Neuro/Psych:  Normal mood and affect.  Skin:  Warm and dry.  Lymphatic:  No inguinal lymphadenopathy.   Pelvic exam: is limited by body habitus EGBUS: within normal limits, Vagina: within  normal limits and with no blood or discharge in the vault, Cervix: normal appearing cervix without tenderness, discharge or lesions. Uterus:  nonenlarged and non tender and Adnexa:  normal adnexa and no mass, fullness, tenderness Rectovaginal: deferred  Laboratory: none  Radiology: none  Assessment: pt doing well  Plan:  1. Encounter for gynecological examination without abnormal finding Routine care. D/w her that it is common that Nexplanon can starting wearing off with the beneficial amenorrhea SE after initial insertion; she had it placed in 05/2015. Recommend trying NSAIDs day prior and for first few days of period. Pt would like STI testing. Recommend rpt pap next year - Cervicovaginal ancillary only - HIV antibody (with reflex) - RPR - Hepatitis B Surface AntiGEN - Comprehensive metabolic panel - Hemoglobin A1c   2. BMI 50.0-59.9, adult (HCC) Weight stays in the 250lbs range. D/w her that this is her most pressing issue and she would like to see BS for d/w them. D/w her that she'll need to likely show good motivation with any potential surgery with nutrition and dietician. Prior TSH, lipid profile negative. a1c ordered.   Orders Placed This Encounter  Procedures  . HIV antibody (with reflex)  . RPR  . Hepatitis B Surface AntiGEN  . Comprehensive metabolic panel  . Hemoglobin A1c  . Ambulatory referral to General Surgery    RTC 1 year  [place] Bingharlie Harli Engelken, Montez HagemanJr MD Attending [facility](Faculty Practice)

## 2016-12-23 ENCOUNTER — Other Ambulatory Visit: Payer: Self-pay | Admitting: Obstetrics and Gynecology

## 2016-12-23 LAB — COMPREHENSIVE METABOLIC PANEL
ALT: 8 IU/L (ref 0–32)
AST: 12 IU/L (ref 0–40)
Albumin/Globulin Ratio: 1.4 (ref 1.2–2.2)
Albumin: 3.9 g/dL (ref 3.5–5.5)
Alkaline Phosphatase: 59 IU/L (ref 39–117)
BUN/Creatinine Ratio: 16 (ref 9–23)
BUN: 10 mg/dL (ref 6–20)
Bilirubin Total: 0.4 mg/dL (ref 0.0–1.2)
CALCIUM: 9 mg/dL (ref 8.7–10.2)
CO2: 25 mmol/L (ref 18–29)
Chloride: 102 mmol/L (ref 96–106)
Creatinine, Ser: 0.61 mg/dL (ref 0.57–1.00)
GFR, EST AFRICAN AMERICAN: 146 mL/min/{1.73_m2} (ref >59)
GFR, EST NON AFRICAN AMERICAN: 126 mL/min/{1.73_m2} (ref >59)
GLUCOSE: 90 mg/dL (ref 65–99)
Globulin, Total: 2.8 g/dL (ref 1.5–4.5)
POTASSIUM: 4.2 mmol/L (ref 3.5–5.2)
Sodium: 141 mmol/L (ref 134–144)
TOTAL PROTEIN: 6.7 g/dL (ref 6.0–8.5)

## 2016-12-23 LAB — CERVICOVAGINAL ANCILLARY ONLY
Bacterial vaginitis: POSITIVE — AB
CHLAMYDIA, DNA PROBE: NEGATIVE
Candida vaginitis: NEGATIVE
Neisseria Gonorrhea: NEGATIVE
Trichomonas: NEGATIVE

## 2016-12-23 LAB — RPR: RPR: NONREACTIVE

## 2016-12-23 LAB — HEPATITIS B SURFACE ANTIGEN: HEP B S AG: NEGATIVE

## 2016-12-23 LAB — HIV ANTIBODY (ROUTINE TESTING W REFLEX): HIV Screen 4th Generation wRfx: NONREACTIVE

## 2016-12-23 LAB — HEMOGLOBIN A1C
ESTIMATED AVERAGE GLUCOSE: 111 mg/dL
Hgb A1c MFr Bld: 5.5 % (ref 4.8–5.6)

## 2016-12-23 MED ORDER — METRONIDAZOLE 500 MG PO TABS: 500.0000 mg | ORAL_TABLET | Freq: Two times a day (BID) | ORAL | 0 refills | Status: DC

## 2017-01-03 ENCOUNTER — Encounter: Payer: Self-pay | Admitting: Obstetrics and Gynecology

## 2017-01-03 NOTE — Telephone Encounter (Signed)
Please advise, thanks.

## 2017-03-06 ENCOUNTER — Encounter: Payer: Self-pay | Admitting: Obstetrics and Gynecology

## 2017-03-06 DIAGNOSIS — N926 Irregular menstruation, unspecified: Secondary | ICD-10-CM

## 2017-03-08 MED ORDER — NORGESTIMATE-ETH ESTRADIOL 0.25-35 MG-MCG PO TABS: 1.0000 | ORAL_TABLET | Freq: Every day | ORAL | 2 refills | Status: DC

## 2017-04-25 ENCOUNTER — Encounter: Payer: Self-pay | Admitting: Obstetrics and Gynecology

## 2017-04-25 ENCOUNTER — Ambulatory Visit (INDEPENDENT_AMBULATORY_CARE_PROVIDER_SITE_OTHER): Payer: Medicaid Other | Admitting: Obstetrics and Gynecology

## 2017-04-25 VITALS — BP 113/73 | HR 57 | Ht 60.0 in | Wt 257.0 lb

## 2017-04-25 DIAGNOSIS — Z975 Presence of (intrauterine) contraceptive device: Secondary | ICD-10-CM

## 2017-04-25 DIAGNOSIS — Z308 Encounter for other contraceptive management: Secondary | ICD-10-CM

## 2017-04-25 NOTE — Procedures (Signed)
Nexplanon Removal Procedure Note Patient would like to remove Nexplanon and continue on OCPs for another month and then come off hormones for some time after that; see telephone note.   Prior to the procedure being performed, the patient (or guardian) was asked to state their full name, date of birth, type of procedure being performed and the exact location of the operative site. This information was then checked against the documentation in the patient's chart. Prior to the procedure being performed, a "time out" was performed by the physician that confirmed the correct patient, procedure and site.  After informed consent was obtained, the patient's Nexplanon was located in the LUE.  The area was swabbed with alcohol and then injeced with 3mL of lidocaine with epi underneath the Nexplanon. The area was then swabbed with betadine. While wearing sterile gloves, an 11 blade was used to make an incision at the old incision site. The Nexplanon was brought to the incision and the capsule scrapped off and it was easily removed and noted to be intact. Pressure was applied, a steri strip applied and 2x2 gauze with bandages over it applied.   The patient tolerated the procedure well.  Cornelia Copaharlie Mico Spark, Jr MD Attending [facility]ulty Practice)

## 2017-05-17 ENCOUNTER — Encounter (HOSPITAL_COMMUNITY): Payer: Self-pay | Admitting: *Deleted

## 2017-05-17 ENCOUNTER — Ambulatory Visit (HOSPITAL_COMMUNITY)
Admission: EM | Admit: 2017-05-17 | Discharge: 2017-05-17 | Disposition: A | Discharge status: Home / Self Care | Payer: Medicaid Other | Attending: Family Medicine | Admitting: Family Medicine

## 2017-05-17 ENCOUNTER — Ambulatory Visit (INDEPENDENT_AMBULATORY_CARE_PROVIDER_SITE_OTHER): Payer: Self-pay

## 2017-05-17 DIAGNOSIS — S99922A Unspecified injury of left foot, initial encounter: Secondary | ICD-10-CM

## 2017-05-17 NOTE — Discharge Instructions (Signed)
You may use over the counter ibuprofen or acetaminophen as needed.  Your x-rays did not show any broken bones.
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
## 2017-10-21 ENCOUNTER — Telehealth: Payer: Self-pay

## 2017-10-21 NOTE — Telephone Encounter (Signed)
Spoken and notified patient of Kate's comments. Patient verbalized understanding.  Schedule office visit for patient on 10/24/2016

## 2017-10-21 NOTE — Telephone Encounter (Signed)
Copied from CRM [phone]. Topic: General - Other >> Oct 21, 2017 10:21 AM Debroah LoopLander, Lumin L wrote: Reason for CRM: Patient [facility] or her CMA about personal health issue.

## 2017-10-21 NOTE — Telephone Encounter (Signed)
Please notify patient that it could have been constipation or gas. Without any vaginal discharge, vaginal bleeding, fevers then could be more abdominal as cause. I'm always happy to see her if her symptoms return.

## 2017-10-21 NOTE — Telephone Encounter (Signed)
I spoke with pt; pt and partner having sex on 10/19/17; pt had pain all over stomach and had to stop sexual encounter. Pain on lt side of abdomen when pushes on lt side on 10/19/17. No pain now. Pt had normal BM today. Pt wants cb with why had this episode on 10/19/17. Never happened before.Please advise.

## 2017-10-24 ENCOUNTER — Ambulatory Visit: Payer: Self-pay | Admitting: Primary Care

## 2017-10-24 ENCOUNTER — Encounter: Payer: Self-pay | Admitting: Primary Care

## 2017-10-24 VITALS — BP 114/70 | HR 74 | Temp 98.3°F | Wt 264.5 lb

## 2017-10-24 DIAGNOSIS — N941 Unspecified dyspareunia: Secondary | ICD-10-CM

## 2017-10-24 NOTE — Patient Instructions (Signed)
We will be in touch once we receive your results.  Use protection during intercourse.  Monitor your menstrual cycle and notify if no menstrual cycle by late February 2019.  It was a pleasure to see you today!   Dyspareunia, Female Dyspareunia is pain that is associated with sexual activity. This can affect any part of the genitals or lower abdomen, and there are many possible causes. This condition ranges from mild to severe. Depending on the cause, dyspareunia may get better with treatment, or it may return (recur) over time. What are the causes? The cause of this condition is not always known. Possible causes include:  Cancer.  Psychological factors, such as depression, anxiety, or previous traumatic experiences.  Severe pain and tenderness of the skin around the vagina (vulva) when it is touched (vulvar vestibulitis syndrome).  Infection of the pelvis or the vulva.  Infection of the vagina.  Painful, involuntary tightening (contraction) of the vaginal muscles when anything is put inside the vagina (vaginismus).  Allergic reaction.  Ovarian cysts.  Solid growths of tissue (tumors) in the ovaries or the uterus.  Scar tissue in the ovaries, vagina, or pelvis.  Vaginal dryness.  Thinning of the tissue (atrophy) of the vulva and vagina.  Skin conditions that affect the vulva (vulvar dermatoses), such as lichen sclerosus or lichen planus.  Endometriosis.  Tubal pregnancy.  A tilted uterus.  Uterine prolapse.  Adhesions in the vagina.  Bladder problems.  Intestinal problems.  Certain medicines.  Medical conditions such as diabetes, arthritis, or thyroid disease.  What increases the risk? The following factors may make you more likely to develop this condition:  Having experienced physical or sexual trauma.  Having given birth more than once.  Taking birth control pills.  Having gone through menopause.  Having recently given birth, typically within the  past 3-6 months.  Breastfeeding.  What are the signs or symptoms? The main symptom of this condition is pain in any part of the genitals or lower abdomen during or after sexual activity. This may include pain during sexual arousal, genital stimulation, or orgasm. Pain may get worse when anything is inserted into the vagina, or when the genitals are touched in any way, such as when sitting or wearing pants. Pain can range from mild to severe, depending on the cause of the condition. In some cases, symptoms go away with treatment and return (recur) at a later date. How is this diagnosed? This condition may be diagnosed based on:  Your symptoms, including: ? Where your pain is located. ? When your pain occurs.  Your medical history.  A physical exam. This may include a pelvic exam and a Pap test. This is a screening test that is used to check for signs of cancer of the vagina, cervix, and uterus.  Tests, including: ? Blood tests. ? Ultrasound. This uses sound waves to make a picture of the area that is being tested. ? Urine culture. This test involves checking a urine sample for signs of infection. ? Culture test. This is when your health care provider uses a swab to collect a sample of vaginal fluid. The sample is checked for signs of infection. ? X-rays. ? MRI. ? CT scan. ? Laparoscopy. This is a procedure in which a small incision is made in your lower abdomen and a lighted, pencil-sized instrument (laparoscope) is passed through the incision and used to look inside your pelvis.  You may be referred to a health care provider who specializes in women's  health (gynecologist). In some cases, diagnosing the cause of dyspareunia can be difficult. How is this treated? Treatment depends on the cause of your condition and your symptoms. In most cases, you may need to stop sexual activity until your symptoms improve. Treatment may include:  Lubricants.  Kegel exercises or vaginal  dilators.  Medicated skin creams.  Medicated vaginal creams.  Hormonal therapy.  Antibiotic medicine to prevent or fight infection.  Medicines that help to relieve pain.  Medicines that treat depression (antidepressants).  Psychological counseling.  Sex therapy.  Surgery.  Follow these instructions at home: Lifestyle  Avoid tight clothing and irritating materials around your genital and abdominal area.  Use water-based lubricants as needed. Avoid oil-based lubricants.  Do not use any products that irritate you. This may include certain condoms, spermicides, lubricants, soaps, tampons, vaginal sprays, or douches.  Always practice safe sex. Talk with your health care provider about which form of birth control (contraception) is best for you.  Maintain open communication with your sexual partner. General instructions  Take over-the-counter and prescription medicines only as told by your health care provider.  If you had tests done, it is your responsibility to get your tests results. Ask your health care provider or the department performing the test when your results will be ready.  Urinate before you engage in sexual activity.  Consider joining a support group.  Keep all follow-up visits as told by your health care provider. This is important. Contact a health care provider if:  You develop vaginal bleeding after sexual intercourse.  You develop a lump at the opening of your vagina. Seek medical care even if the lump is painless.  You have: ? Abnormal vaginal discharge. ? Vaginal dryness. ? Itchiness or irritation of your vulva or vagina. ? A new rash. ? Symptoms that get worse or do not improve with treatment. ? A fever. ? Pain when you urinate. ? Blood in your urine. Get help right away if:  You develop severe pain in your abdomen during or shortly after sexual intercourse.  You pass out after having sexual intercourse. This information is not intended  to replace advice given to you by your health care provider. Make sure you discuss any questions you have with your health care provider. Document Released: 10/17/2007 Document Revised: 02/06/2016 Document Reviewed: 04/29/2015 Elsevier Interactive Patient Education  [employer]2018 [employer] Inc[employer]

## 2017-10-24 NOTE — Progress Notes (Signed)
Subjective:    Patient ID: Sonya Schwartz, female    DOB: 1990/12/07, 27 y.o.   MRN: [id number]  HPI  [name] is a 27 year old female who presents today with a chief complaint of abdominal pain. Her pain began during intercourse that was sudden and sharp. Her pain was located to the bilateral lower abdomen which lasted for the remaining of the day. She did feel nauseated during the pain episode. She's not had pain since intercourse last Wednesday. She's not had intercourse since that time.   She denies vaginal discharge, vaginal itching, diarrhea, vomiting. She's not had a regular cycle since November 2018. She bleed for one day during December, then stopped. She did take a Plan B tablet in November 2018. She is not currently taking oral contraceptives.   Review of Systems  Constitutional: Negative for fever.  Cardiovascular: Negative for palpitations.  Genitourinary: Positive for menstrual problem. Negative for dysuria, flank pain and vaginal discharge.       Past Medical History:  Diagnosis Date  . History of chlamydia   . Infection 2011   CHLAMYDIA  . Morbid obesity with BMI of 45.0-49.9, adult [facility])      Social History   Socioeconomic History  . Marital status: Single    Spouse name: Not on file  . Number of children: Not on file  . Years of education: Not on file  . Highest education level: Not on file  Social Needs  . Financial resource strain: Not on file  . Food insecurity - worry: Not on file  . Food insecurity - inability: Not on file  . Transportation needs - medical: Not on file  . Transportation needs - non-medical: Not on file  Occupational History  . Not on file  Tobacco Use  . Smoking status: Never Smoker  . Smokeless tobacco: Never Used  Substance and Sexual Activity  . Alcohol use: Yes    Comment: rare  . Drug use: No  . Sexual activity: Yes    Partners: Male    Birth control/protection: Implant  Other Topics Concern  . Not on file    Social History Narrative   Single.   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    Enjoys listening to music, being with friends.    Past Surgical History:  Procedure Laterality Date  . HYMENECTOMY    . VAGINA SURGERY     at age 13. for menstrual flow.  . WISDOM TOOTH EXTRACTION     x 4    Family History  Problem Relation Age of Onset  . Hypertension Mother   . Heart disease Mother   . Arthritis Mother   . Bronchiolitis Brother   . Bronchiolitis Sister     No Known Allergies  Current Outpatient Medications on File Prior to Visit  Medication Sig Dispense Refill  . norgestimate-ethinyl estradiol (ORTHO-CYCLEN,SPRINTEC,PREVIFEM) 0.25-35 MG-MCG tablet Take 1 tablet by mouth daily. (Patient not taking: Reported on 10/24/2017) 1 Package 2   No current facility-administered medications on file prior to visit.     BP 114/70   Pulse 74   Temp 98.3 F (36.8 C) (Oral)   Wt 264 lb 8 oz (120 kg)   LMP 09/03/2017   Breastfeeding? Unknown   BMI 51.66 kg/m    Objective:   Physical Exam  Constitutional: She appears well-nourished.  Neck: Neck supple.  Cardiovascular: Normal rate.  Pulmonary/Chest: Effort normal.  Genitourinary:  There is no tenderness on the right labia. There is no tenderness on the left labia. Cervix exhibits discharge. Cervix exhibits no motion tenderness. No tenderness in the vagina. No vaginal discharge found.  Genitourinary Comments: Scant amount of whitish discharge, no foul odor,  Skin: Skin is warm and dry.          Assessment & Plan:  Dyspareunia:  Pelvic and left lower abdominal discomfort during and after intercourse, no symptoms since. No other abdominal or pelvic symptoms. Pelvic exam today unremarkable.  Wet prep and gonorrhea/chlamydia sent off today. Discussed to use protection during intercourse. Also discussed to notify if no menstrual cycle by late February. Return  precautions provided.  Morrie Sheldonlark,Rajanae Mantia Kendal, NP

## 2017-10-24 NOTE — Addendum Note (Signed)
Addended by: Alvina ChouWALSH, Shantrice Rodenberg J on: 10/24/2017 09:01 AM   Modules accepted: Orders

## 2017-10-25 ENCOUNTER — Other Ambulatory Visit: Payer: Self-pay | Admitting: Primary Care

## 2017-10-25 ENCOUNTER — Encounter: Payer: Self-pay | Admitting: Primary Care

## 2017-10-25 DIAGNOSIS — Z113 Encounter for screening for infections with a predominantly sexual mode of transmission: Secondary | ICD-10-CM

## 2017-10-25 LAB — WET PREP BY MOLECULAR PROBE
CANDIDA SPECIES: NOT DETECTED
GARDNERELLA VAGINALIS: NOT DETECTED
MICRO NUMBER:: 90053941
SPECIMEN QUALITY:: ADEQUATE
TRICHOMONAS VAG: NOT DETECTED

## 2017-10-25 LAB — TIQ-NTM

## 2017-10-25 LAB — C. TRACHOMATIS/N. GONORRHOEAE RNA

## 2017-12-12 ENCOUNTER — Other Ambulatory Visit (INDEPENDENT_AMBULATORY_CARE_PROVIDER_SITE_OTHER): Payer: Self-pay

## 2017-12-12 DIAGNOSIS — Z113 Encounter for screening for infections with a predominantly sexual mode of transmission: Secondary | ICD-10-CM

## 2017-12-13 LAB — C. TRACHOMATIS/N. GONORRHOEAE RNA
C. TRACHOMATIS RNA, TMA: NOT DETECTED
N. gonorrhoeae RNA, TMA: NOT DETECTED

## 2017-12-16 ENCOUNTER — Encounter: Payer: Self-pay | Admitting: Primary Care

## 2017-12-16 ENCOUNTER — Ambulatory Visit: Payer: Self-pay | Admitting: Primary Care

## 2017-12-16 ENCOUNTER — Other Ambulatory Visit (HOSPITAL_COMMUNITY)
Admission: RE | Admit: 2017-12-16 | Discharge: 2017-12-16 | Disposition: A | Discharge status: Home / Self Care | Payer: Medicaid Other | Source: Ambulatory Visit | Attending: Primary Care | Admitting: Primary Care

## 2017-12-16 VITALS — BP 116/72 | HR 86 | Temp 98.6°F | Ht 60.0 in | Wt 261.4 lb

## 2017-12-16 DIAGNOSIS — Z124 Encounter for screening for malignant neoplasm of cervix: Secondary | ICD-10-CM

## 2017-12-16 DIAGNOSIS — N926 Irregular menstruation, unspecified: Secondary | ICD-10-CM | POA: Insufficient documentation

## 2017-12-16 LAB — TSH: TSH: 1.92 u[IU]/mL (ref 0.35–4.50)

## 2017-12-16 LAB — LIPID PANEL
Cholesterol: 161 mg/dL (ref 0–200)
HDL: 47.4 mg/dL (ref >39.00)
LDL CALC: 100 mg/dL — AB (ref 0–99)
NonHDL: 113.21
Total CHOL/HDL Ratio: 3
Triglycerides: 68 mg/dL (ref 0.0–149.0)
VLDL: 13.6 mg/dL (ref 0.0–40.0)

## 2017-12-16 LAB — HEMOGLOBIN A1C: Hgb A1c MFr Bld: 5.7 % (ref 4.6–6.5)

## 2017-12-16 LAB — POCT URINE PREGNANCY: Preg Test, Ur: NEGATIVE

## 2017-12-16 MED ORDER — NORGESTIMATE-ETH ESTRADIOL 0.25-35 MG-MCG PO TABS: 1.0000 | ORAL_TABLET | Freq: Every day | ORAL | 1 refills | Status: DC

## 2017-12-16 NOTE — Assessment & Plan Note (Addendum)
Had more regular cycles with OCP's, has been off since September.   Discuss other causes including PCOS, thyroid disorders, obesity. Strongly encouraged her to work on weight loss, information provided.   Check A1C, TSH, Lipids. Urine pregnancy negative. Rx for Sprintec sent to pharmacy, discussed instructions for administration. Pap smear collected and pending.

## 2017-12-16 NOTE — Progress Notes (Signed)
Subjective:    Patient ID: Sonya Schwartz, female    DOB: [birth year], 27 y.o.   MRN: [id number]  HPI  [name] is a 27 year old female who presents today with multiple complaints.  1) Rash: Located to the left upper anterior chest for which she noticed three weeks ago. She thinks it's improving overall. She's had mild itching infrequently. She's not applied anything OTC.   2) Birth Control Options: Previously managed on Sprintec 0.25-35 mg-mcg for which she stopped in September 2018. Had been on birth control since 2010. LMP was 10/29/17. She had a menstrual cycle in November 2018, then skipped in December 2018. Her last Pap smear was in 2015,  Diet currently consists of:  Breakfast: Yogurt, oatmeal, fast food once weekly, sometimes skips Lunch: Fast food Dinner: Fast food  Snacks: Occasionally Desserts: Daily  Beverages: sweet tea, soda, little water.   Exercise: She is not exercising.    Review of Systems  Respiratory: Negative for shortness of breath.   Cardiovascular: Negative for chest pain.  Genitourinary: Positive for menstrual problem.  Neurological: Negative for dizziness and headaches.       Past Medical History:  Diagnosis Date  . History of chlamydia   . Infection 2011   CHLAMYDIA  . Morbid obesity with BMI of 45.0-49.9, adult [facility])      Social History   Socioeconomic History  . Marital status: Single    Spouse name: Not on file  . Number of children: Not on file  . Years of education: Not on file  . Highest education level: Not on file  Social Needs  . Financial resource strain: Not on file  . Food insecurity - worry: Not on file  . Food insecurity - inability: Not on file  . Transportation needs - medical: Not on file  . Transportation needs - non-medical: Not on file  Occupational History  . Not on file  Tobacco Use  . Smoking status: Never Smoker  . Smokeless tobacco: Never Used  Substance and Sexual Activity  . Alcohol use: Yes     Comment: rare  . Drug use: No  . Sexual activity: Yes    Partners: Male    Birth control/protection: Implant  Other Topics Concern  . Not on file  Social History Narrative   Single.   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    Enjoys listening to music, being with friends.    Past Surgical History:  Procedure Laterality Date  . HYMENECTOMY    . VAGINA SURGERY     at age 74. for menstrual flow.  . WISDOM TOOTH EXTRACTION     x 4    Family History  Problem Relation Age of Onset  . Hypertension Mother   . Heart disease Mother   . Arthritis Mother   . Bronchiolitis Brother   . Bronchiolitis Sister     No Known Allergies  Current Outpatient Medications on File Prior to Visit  Medication Sig Dispense Refill  . norgestimate-ethinyl estradiol (ORTHO-CYCLEN,SPRINTEC,PREVIFEM) 0.25-35 MG-MCG tablet Take 1 tablet by mouth daily. (Patient not taking: Reported on 10/24/2017) 1 Package 2   No current facility-administered medications on file prior to visit.     BP 116/72   Pulse 86   Temp 98.6 F (37 C) (Oral)   Ht 5' (1.524 m)   Wt 261 lb 6.4 oz (118.6 kg)   SpO2 98%   BMI 51.05 kg/m  Objective:   Physical Exam  Constitutional: She appears well-nourished.  Neck: Neck supple.  Cardiovascular: Normal rate and regular rhythm.  Pulmonary/Chest: Effort normal and breath sounds normal.  Skin: Skin is warm and dry.          Assessment & Plan:

## 2017-12-16 NOTE — Patient Instructions (Addendum)
Stop by the lab prior to leaving today. I will notify you of your results once received.   Apply clotrimazole cream twice daily to the affected area, please notify me if no improvement in 1 week.   Start exercising. You should be getting 150 minutes of moderate intensity exercise weekly.  It's important to improve your diet by reducing consumption of fast food, fried food, processed snack foods, sugary drinks. Increase consumption of fresh vegetables and fruits, whole grains, water. Don't drink your calories.   Ensure you are drinking 64 ounces of water daily.  Download the App My Fitness Pal to start tracking calories.   Start your birth control this Sunday.  You must take your pill at the same time each day.  If you miss a pill then take the pill as soon as you remember, and also take your pill at the regularly scheduled time, even if this means taking two pills in one day. If you miss more than two pills consecutively, then please call me.  It may take three to six months for birth control pills to regulate your cycle and improve symptoms.  It was a pleasure to see you today!   Calorie Counting for Weight Loss Calories are units of energy. Your body needs a certain amount of calories from food to keep you going throughout the day. When you eat more calories than your body needs, your body stores the extra calories as fat. When you eat fewer calories than your body needs, your body burns fat to get the energy it needs. Calorie counting means keeping track of how many calories you eat and drink each day. Calorie counting can be helpful if you need to lose weight. If you make sure to eat fewer calories than your body needs, you should lose weight. Ask your health care provider what a healthy weight is for you. For calorie counting to work, you will need to eat the right number of calories in a day in order to lose a healthy amount of weight per week. A dietitian can help you determine how  many calories you need in a day and will give you suggestions on how to reach your calorie goal.  A healthy amount of weight to lose per week is usually 1-2 lb (0.5-0.9 kg). This usually means that your daily calorie intake should be reduced by 500-750 calories.  Eating 1,200 - 1,500 calories per day can help most women lose weight.  Eating 1,500 - 1,800 calories per day can help most men lose weight.  What is my plan? My goal is to have __________ calories per day. If I have this many calories per day, I should lose around __________ pounds per week. What do I need to know about calorie counting? In order to meet your daily calorie goal, you will need to:  Find out how many calories are in each food you would like to eat. Try to do this before you eat.  Decide how much of the food you plan to eat.  Write down what you ate and how many calories it had. Doing this is called keeping a food log.  To successfully lose weight, it is important to balance calorie counting with a healthy lifestyle that includes regular activity. Aim for 150 minutes of moderate exercise (such as walking) or 75 minutes of vigorous exercise (such as running) each week. Where do I find calorie information?  The number of calories in a food can be found on a  Nutrition Facts label. If a food does not have a Nutrition Facts label, try to look up the calories online or ask your dietitian for help. Remember that calories are listed per serving. If you choose to have more than one serving of a food, you will have to multiply the calories per serving by the amount of servings you plan to eat. For example, the label on a package of bread might say that a serving size is 1 slice and that there are 90 calories in a serving. If you eat 1 slice, you will have eaten 90 calories. If you eat 2 slices, you will have eaten 180 calories. How do I keep a food log? Immediately after each meal, record the following information in your food  log:  What you ate. Don't forget to include toppings, sauces, and other extras on the food.  How much you ate. This can be measured in cups, ounces, or number of items.  How many calories each food and drink had.  The total number of calories in the meal.  Keep your food log near you, such as in a small notebook in your pocket, or use a mobile app or website. Some programs will calculate calories for you and show you how many calories you have left for the day to meet your goal. What are some calorie counting tips?  Use your calories on foods and drinks that will fill you up and not leave you hungry: ? Some examples of foods that fill you up are nuts and nut butters, vegetables, lean proteins, and high-fiber foods like whole grains. High-fiber foods are foods with more than 5 g fiber per serving. ? Drinks such as sodas, specialty coffee drinks, alcohol, and juices have a lot of calories, yet do not fill you up.  Eat nutritious foods and avoid empty calories. Empty calories are calories you get from foods or beverages that do not have many vitamins or protein, such as candy, sweets, and soda. It is better to have a nutritious high-calorie food (such as an avocado) than a food with few nutrients (such as a bag of chips).  Know how many calories are in the foods you eat most often. This will help you calculate calorie counts faster.  Pay attention to calories in drinks. Low-calorie drinks include water and unsweetened drinks.  Pay attention to nutrition labels for "low fat" or "fat free" foods. These foods sometimes have the same amount of calories or more calories than the full fat versions. They also often have added sugar, starch, or salt, to make up for flavor that was removed with the fat.  Find a way of tracking calories that works for you. Get creative. Try different apps or programs if writing down calories does not work for you. What are some portion control tips?  Know how many  calories are in a serving. This will help you know how many servings of a certain food you can have.  Use a measuring cup to measure serving sizes. You could also try weighing out portions on a kitchen scale. With time, you will be able to estimate serving sizes for some foods.  Take some time to put servings of different foods on your favorite plates, bowls, and cups so you know what a serving looks like.  Try not to eat straight from a bag or box. Doing this can lead to overeating. Put the amount you would like to eat in a cup or on a plate to  make sure you are eating the right portion.  Use smaller plates, glasses, and bowls to prevent overeating.  Try not to multitask (for example, watch TV or use your computer) while eating. If it is time to eat, sit down at a table and enjoy your food. This will help you to know when you are full. It will also help you to be aware of what you are eating and how much you are eating. What are tips for following this plan? Reading food labels  Check the calorie count compared to the serving size. The serving size may be smaller than what you are used to eating.  Check the source of the calories. Make sure the food you are eating is high in vitamins and protein and low in saturated and trans fats. Shopping  Read nutrition labels while you shop. This will help you make healthy decisions before you decide to purchase your food.  Make a grocery list and stick to it. Cooking  Try to cook your favorite foods in a healthier way. For example, try baking instead of frying.  Use low-fat dairy products. Meal planning  Use more fruits and vegetables. Half of your plate should be fruits and vegetables.  Include lean proteins like poultry and fish. How do I count calories when eating out?  Ask for smaller portion sizes.  Consider sharing an entree and sides instead of getting your own entree.  If you get your own entree, eat only half. Ask for a box at the  beginning of your meal and put the rest of your entree in it so you are not tempted to eat it.  If calories are listed on the menu, choose the lower calorie options.  Choose dishes that include vegetables, fruits, whole grains, low-fat dairy products, and lean protein.  Choose items that are boiled, broiled, grilled, or steamed. Stay away from items that are buttered, battered, fried, or served with cream sauce. Items labeled "crispy" are usually fried, unless stated otherwise.  Choose water, low-fat milk, unsweetened iced tea, or other drinks without added sugar. If you want an alcoholic beverage, choose a lower calorie option such as a glass of wine or light beer.  Ask for dressings, sauces, and syrups on the side. These are usually high in calories, so you should limit the amount you eat.  If you want a salad, choose a garden salad and ask for grilled meats. Avoid extra toppings like bacon, cheese, or fried items. Ask for the dressing on the side, or ask for olive oil and vinegar or lemon to use as dressing.  Estimate how many servings of a food you are given. For example, a serving of cooked rice is  cup or about the size of half a baseball. Knowing serving sizes will help you be aware of how much food you are eating at restaurants. The list below tells you how big or small some common portion sizes are based on everyday objects: ? 1 oz-4 stacked dice. ? 3 oz-1 deck of cards. ? 1 tsp-1 die. ? 1 Tbsp- a ping-pong ball. ? 2 Tbsp-1 ping-pong ball. ?  cup- baseball. ? 1 cup-1 baseball. Summary  Calorie counting means keeping track of how many calories you eat and drink each day. If you eat fewer calories than your body needs, you should lose weight.  A healthy amount of weight to lose per week is usually 1-2 lb (0.5-0.9 kg). This usually means reducing your daily calorie intake by 500-750 calories.  The number of calories in a food can be found on a Nutrition Facts label. If a food  does not have a Nutrition Facts label, try to look up the calories online or ask your dietitian for help.  Use your calories on foods and drinks that will fill you up, and not on foods and drinks that will leave you hungry.  Use smaller plates, glasses, and bowls to prevent overeating. This information is not intended to replace advice given to you by your health care provider. Make sure you discuss any questions you have with your health care provider. Document Released: 09/27/2005 Document Revised: 08/27/2016 Document Reviewed: 08/27/2016 Elsevier Interactive Patient Education  [employer]2018 [employer].

## 2017-12-20 LAB — CYTOLOGY - PAP: DIAGNOSIS: NEGATIVE

## 2018-03-07 ENCOUNTER — Other Ambulatory Visit: Payer: Self-pay

## 2018-03-07 ENCOUNTER — Ambulatory Visit (HOSPITAL_COMMUNITY)
Admission: EM | Admit: 2018-03-07 | Discharge: 2018-03-07 | Disposition: A | Discharge status: Home / Self Care | Payer: Medicaid Other | Attending: Emergency Medicine | Admitting: Emergency Medicine

## 2018-03-07 ENCOUNTER — Encounter (HOSPITAL_COMMUNITY): Payer: Self-pay | Admitting: Emergency Medicine

## 2018-03-07 ENCOUNTER — Ambulatory Visit: Payer: Self-pay

## 2018-03-07 DIAGNOSIS — R05 Cough: Secondary | ICD-10-CM

## 2018-03-07 DIAGNOSIS — R059 Cough, unspecified: Secondary | ICD-10-CM

## 2018-03-07 DIAGNOSIS — T7840XA Allergy, unspecified, initial encounter: Secondary | ICD-10-CM

## 2018-03-07 MED ORDER — PREDNISONE 10 MG (21) PO TBPK: ORAL_TABLET | Freq: Every day | ORAL | 0 refills | Status: DC

## 2018-03-07 NOTE — Discharge Instructions (Signed)
If you begin to have this reaction again with hives or shortness of breath you will need to go to the ER

## 2018-03-07 NOTE — ED Provider Notes (Signed)
[facility]    CSN: [id number] Arrival date & time: 03/07/18  1018     History   Chief Complaint Chief Complaint  Patient presents with  . Cough    HPI Sonya Schwartz is a 27 y.o. female.   Pt states that she was eating [employer] pizza and an hour later noticed hives to her face and then had coughing. She is unsure is she was having an allergic reaction or not. Has not had an hives since but has had  Coughing . Denies any sob, no chest pain. Has had to take allergy meds in the past but has not been taking lately. Took 2 benadryl at the time of reaction and the hives went away      Past Medical History:  Diagnosis Date  . History of chlamydia   . Infection 2011   CHLAMYDIA  . Morbid obesity with BMI of 45.0-49.9, adult [facility])     Patient Active Problem List   Diagnosis Date Noted  . Irregular menstrual cycle 12/16/2017  . Preventative health care 05/04/2016  . Migraine without aura and with status migrainosus, not intractable 02/25/2016  . Obesity 05/12/2012    Past Surgical History:  Procedure Laterality Date  . HYMENECTOMY    . VAGINA SURGERY     at age 36. for menstrual flow.  . WISDOM TOOTH EXTRACTION     x 4    OB History    Gravida  1   Para  0   Term  0   Preterm  0   AB  0   Living  0     SAB  0   TAB  0   Ectopic  0   Multiple  0   Live Births               Home Medications    Prior to Admission medications   Not on File    Family History Family History  Problem Relation Age of Onset  . Hypertension Mother   . Heart disease Mother   . Arthritis Mother   . Bronchiolitis Brother   . Bronchiolitis Sister     Social History Social History   Tobacco Use  . Smoking status: Never Smoker  . Smokeless tobacco: Never Used  Substance Use Topics  . Alcohol use: Yes    Comment: rare  . Drug use: No     Allergies   Patient has no known allergies.   Review of Systems Review of Systems    Constitutional: Negative.   HENT: Negative.   Eyes: Negative.   Respiratory: Positive for cough.   Gastrointestinal: Negative.   Genitourinary: Negative.   Skin: Negative.   Neurological: Negative.      Physical Exam Triage Vital Signs ED Triage Vitals  Enc Vitals Group     BP 03/07/18 1127 119/87     Pulse Rate 03/07/18 1127 71     Resp 03/07/18 1127 18     Temp 03/07/18 1127 98.7 F (37.1 C)     Temp Source 03/07/18 1127 Oral     SpO2 03/07/18 1127 100 %     Weight --      Height --      Head Circumference --      Peak Flow --      Pain Score 03/07/18 1125 3     Pain Loc --      Pain Edu? --      Excl. in GC? --  No data found.  Updated Vital Signs BP 119/87 (BP Location: Left Wrist)   Pulse 71   Temp 98.7 F (37.1 C) (Oral)   Resp 18   SpO2 100%   Visual Acuity     Physical Exam  Constitutional: She appears well-developed.  HENT:  Head: Normocephalic.  Right Ear: External ear normal.  Left Ear: External ear normal.  Nose: Nose normal.  Mouth/Throat: Oropharynx is clear and moist.  Eyes: Pupils are equal, round, and reactive to light.  Neck: Normal range of motion.  Cardiovascular: Normal rate.  Pulmonary/Chest: Effort normal and breath sounds normal.  Abdominal: Soft.  Musculoskeletal: Normal range of motion.  Neurological: She is alert.  Skin: Skin is warm.     UC Treatments / Results  Labs (all labs ordered are listed, but only abnormal results are displayed) Labs Reviewed - No data to display  EKG None  Radiology No results found.  Procedures Procedures (including critical care time)  Medications Ordered in UC Medications - No data to display  Initial Impression / Assessment and Plan / UC Course  I have reviewed the triage vital signs and the nursing notes.  Pertinent labs & imaging results that were available during my care of the patient were reviewed by me and considered in my medical decision making (see chart for  details).    Avoid any foods from [employer] until allergy tested Will give prednisone to help open lungs and help with coughing may take a few weeks to go away,  Began taking zyrtec daily   Final Clinical Impressions(s) / UC Diagnoses   Final diagnoses:  None   Discharge Instructions   None    ED Prescriptions    None     Controlled Substance Prescriptions [place] Controlled Substance Registry consulted? Not Applicable   Coralyn Mark, NP 03/07/18 1152

## 2018-03-07 NOTE — ED Triage Notes (Signed)
The patient presented to the [facility] with a complaint of a cough x 1 week. 

## 2018-03-07 NOTE — Telephone Encounter (Signed)
Pt calling to get appt for allergy testing. She is c/o SOB. Pt is talking is full sentences and SOB is mild. She stated that her SOB began last Thursday. Coughing and vomiting began after eating pizza from [employer]. At that time she also broke out in hives. Pt has congested cough and chest pain that does not radiate and is worsened with coughing. She denies sore throat. Pt has runny nose. Denies fever.  Pt was seen at [facility] and pt states that "they did nothing for me." She was prescribed prednisone but has not picked up from pharmacy yet.  Pt thinks that congestion is causing the breathing problem.  Advised pt to call or go to the nearest ED of SOB or chest pain worsen. Appt given for tomorrow with Dr Sharen Hones. No openings with PCP.   Reason for Disposition . [1] MILD difficulty breathing (e.g., minimal/no SOB at rest, SOB with walking, pulse <100) AND [2] NEW-onset or WORSE than normal  Answer Assessment - Initial Assessment Questions 1. RESPIRATORY STATUS: "Describe your breathing?" (e.g., wheezing, shortness of breath, unable to speak, severe coughing)      Shortness of breath, chest congestion 2. ONSET: "When did this breathing problem begin?"      Last Thursday 3. PATTERN "Does the difficult breathing come and go, or has it been constant since it started?"      constant 4. SEVERITY: "How bad is your breathing?" (e.g., mild, moderate, severe)    - MILD: No SOB at rest, mild SOB with walking, speaks normally in sentences, can lay down, no retractions, pulse < 100.    - MODERATE: SOB at rest, SOB with minimal exertion and prefers to sit, cannot lie down flat, speaks in phrases, mild retractions, audible wheezing, pulse 100-120.    - SEVERE: Very SOB at rest, speaks in single words, struggling to breathe, sitting hunched forward, retractions, pulse > 120     mild 5. RECURRENT SYMPTOM: "Have you had difficulty breathing before?" If so, ask: "When was the last time?" and "What happened that time?"       no 6. CARDIAC HISTORY: "Do you have any history of heart disease?" (e.g., heart attack, angina, bypass surgery, angioplasty)      no 7. LUNG HISTORY: "Do you have any history of lung disease?"  (e.g., pulmonary embolus, asthma, emphysema)     no 8. CAUSE: "What do you think is causing the breathing problem?"      congestion 9. OTHER SYMPTOMS: "Do you have any other symptoms? (e.g., dizziness, runny nose, cough, chest pain, fever)     Cough, runny nose, when she wakes up has chest pain that is worse with coughing, no fever 10. PREGNANCY: "Is there any chance you are pregnant?" "When was your last menstrual period?"       No LMP: 01/30/18 11. TRAVEL: "Have you traveled out of the country in the last month?" (e.g., travel history, exposures)       no  Protocols used: BREATHING DIFFICULTY-A-AH

## 2018-03-08 ENCOUNTER — Telehealth: Payer: Self-pay

## 2018-03-08 ENCOUNTER — Encounter: Payer: Self-pay | Admitting: Family Medicine

## 2018-03-08 ENCOUNTER — Ambulatory Visit (INDEPENDENT_AMBULATORY_CARE_PROVIDER_SITE_OTHER): Payer: Self-pay | Admitting: Family Medicine

## 2018-03-08 VITALS — BP 120/84 | HR 78 | Temp 98.8°F | Ht 60.0 in | Wt 265.5 lb

## 2018-03-08 DIAGNOSIS — T7840XA Allergy, unspecified, initial encounter: Secondary | ICD-10-CM | POA: Insufficient documentation

## 2018-03-08 NOTE — Progress Notes (Signed)
BP 120/84 (BP Location: Left Arm, Patient Position: Sitting, Cuff Size: Large)   Pulse 78   Temp 98.8 F (37.1 C) (Oral)   Ht 5' (1.524 m)   Wt 265 lb 8 oz (120.4 kg)   LMP 01/30/2018   SpO2 98%   BMI 51.85 kg/m    CC: allergic reaction? Subjective:    Patient ID: Sonya Schwartz, female    DOB: September 21, 1991, 27 y.o.   MRN: [id number]  HPI: Sonya Schwartz is a 27 y.o. female presenting on 03/08/2018 for Emesis (Woke up out of sleep on 03/02/18 vomiting. Has done every morning since then. Says she wakes up gasping for air. States she had hives a few days before and took Benadryl. Was seen at [facility] yesterday and prescribed prednisone.  ) and Referral (Wants to discuss referral to allergist.)   Late to appt. Last Thursday evening broke out in hives on face R>L - brings picture showing papular rash R>L side of face. Treated with oral benadryl x1. Woke up at 3am with shortness of breath, gasping for breath associated with coughing and vomiting thicker mucous. Was able to drink water, went back to sleep. Body aches next day. Since then, having daytime shortness of breath, night time gasping for breath and coughing/vomiting. Throat feels tight.   Seen at [facility] yesterday, told lungs were tight, thought food allergy, treated with sterapred  taper.   No fevers/chills, no tongue or lip swelling. No abdominal pain or nausea. No dizziness or syncope.  No new medicines, vitamins, supplements.  No new lotions, detergents, soaps or shampoos. No new products.  No new foods - did order [employer] sausage and pepperoni pizza with chicken wings before symptoms started.  Prior received allergy shots for seasonal pollen allergy 2010 (rhinorrhea, itchy water eyes, never hives).   Relevant past medical, surgical, family and social history reviewed and updated as indicated. Interim medical history since our last visit reviewed. Allergies and medications reviewed and updated. Outpatient Medications  Prior to Visit  Medication Sig Dispense Refill  . predniSONE (STERAPRED UNI-PAK 21 TAB) 10 MG (21) TBPK tablet Take by mouth daily. Take 6 tabs by mouth daily  for 2 days, then 5 tabs for 2 days, then 4 tabs for 2 days, then 3 tabs for 2 days, 2 tabs for 2 days, then 1 tab by mouth daily for 2 days 42 tablet 0   No facility-administered medications prior to visit.      Per HPI unless specifically indicated in ROS section below Review of Systems     Objective:    BP 120/84 (BP Location: Left Arm, Patient Position: Sitting, Cuff Size: Large)   Pulse 78   Temp 98.8 F (37.1 C) (Oral)   Ht 5' (1.524 m)   Wt 265 lb 8 oz (120.4 kg)   LMP 01/30/2018   SpO2 98%   BMI 51.85 kg/m   Wt Readings from Last 3 Encounters:  03/08/18 265 lb 8 oz (120.4 kg)  12/16/17 261 lb 6.4 oz (118.6 kg)  10/24/17 264 lb 8 oz (120 kg)    Physical Exam  Constitutional: She appears well-developed and well-nourished. No distress.  HENT:  Head: Normocephalic and atraumatic.  Right Ear: Hearing and external ear normal.  Left Ear: Hearing and external ear normal.  Nose: Nose normal. No mucosal edema or rhinorrhea.  Mouth/Throat: Uvula is midline, oropharynx is clear and moist and mucous membranes are normal. No oropharyngeal exudate.  Geographic tongue  Eyes: Pupils are equal, round,  and reactive to light. Conjunctivae and EOM are normal.  Neck: Normal range of motion. Neck supple.  Cardiovascular: Normal rate, regular rhythm and normal heart sounds.  No murmur heard. Pulmonary/Chest: Effort normal and breath sounds normal. No respiratory distress. She has no wheezes. She has no rales.  Abdominal: Soft. Bowel sounds are normal. She exhibits no distension and no mass. There is no tenderness. There is no rebound and no guarding. No hernia.  Lymphadenopathy:    She has no cervical adenopathy.  Neurological: She is alert.  Skin: Skin is warm and dry. Capillary refill takes less than 2 seconds. No rash noted.    Psychiatric: She has a normal mood and affect.  Nursing note and vitals reviewed.  Results for orders placed or performed in visit on 12/16/17  Hemoglobin A1c  Result Value Ref Range   Hgb A1c MFr Bld 5.7 4.6 - 6.5 %  Lipid panel  Result Value Ref Range   Cholesterol 161 0 - 200 mg/dL   Triglycerides 29.5 0.0 - 149.0 mg/dL   HDL 62.13 >08.65 mg/dL   VLDL 78.4 0.0 - 69.6 mg/dL   LDL Cholesterol 295 (H) 0 - 99 mg/dL   Total CHOL/HDL Ratio 3    NonHDL 113.21   TSH  Result Value Ref Range   TSH 1.92 0.35 - 4.50 uIU/mL  POCT urine pregnancy  Result Value Ref Range   Preg Test, Ur Negative Negative  Cytology - PAP  Result Value Ref Range   Adequacy      Satisfactory for evaluation  endocervical/transformation zone component PRESENT.   Diagnosis      NEGATIVE FOR INTRAEPITHELIAL LESIONS OR MALIGNANCY.   Material Submitted CervicoVaginal Pap [ThinPrep Imaged]       Assessment & Plan:   Problem List Items Addressed This Visit    Allergic reaction - Primary    New episode of facial rash without angioedema but with associated dyspnea, cough/vomiting. No significant upper respiratory symptoms, doubt viral URI related. Possible allergic reaction unclear trigger. Encouraged she finish prednisone taper, start daily non sedating antihistamine with benadryl at night PRN.  Given severity and duration of symptoms, reasonable to refer to allergist for further evaluation. Pt agrees with plan.       Relevant Orders   Ambulatory referral to Allergy       No orders of the defined types were placed in this encounter.  Orders Placed This Encounter  Procedures  . Ambulatory referral to Allergy    Referral Priority:   Routine    Referral Type:   Allergy Testing    Referral Reason:   Specialty Services Required    Requested Specialty:   Allergy    Number of Visits Requested:   1    Follow up plan: Return if symptoms worsen or fail to improve.  Eustaquio Boyden, MD

## 2018-03-08 NOTE — Telephone Encounter (Signed)
Copied from CRM [id number]. Topic: Quick Communication - Patient Running Late >> Mar 08, 2018 11:56 AM Oneal Grout wrote: Patient called and is running 10 minutes late. Aware of policy  Route to department's PEC pool.

## 2018-03-08 NOTE — Telephone Encounter (Signed)
Noted  

## 2018-03-08 NOTE — Patient Instructions (Signed)
Sounds like allergic reaction to something.  Continue prednisone treatment.  Add claritin or allegra OTC once daily in the mornings, ok to continue benadryl at night as needed.  We will refer you allergist for further evaluation.

## 2018-03-08 NOTE — Assessment & Plan Note (Signed)
New episode of facial rash without angioedema but with associated dyspnea, cough/vomiting. No significant upper respiratory symptoms, doubt viral URI related. Possible allergic reaction unclear trigger. Encouraged she finish prednisone taper, start daily non sedating antihistamine with benadryl at night PRN.  Given severity and duration of symptoms, reasonable to refer to allergist for further evaluation. Pt agrees with plan.

## 2018-04-20 ENCOUNTER — Emergency Department (HOSPITAL_COMMUNITY)
Admission: EM | Admit: 2018-04-20 | Discharge: 2018-04-20 | Disposition: A | Discharge status: Home / Self Care | Payer: Medicaid Other | Attending: Emergency Medicine | Admitting: Emergency Medicine

## 2018-04-20 ENCOUNTER — Other Ambulatory Visit: Payer: Self-pay

## 2018-04-20 ENCOUNTER — Encounter (HOSPITAL_COMMUNITY): Payer: Self-pay | Admitting: Emergency Medicine

## 2018-04-20 DIAGNOSIS — N390 Urinary tract infection, site not specified: Secondary | ICD-10-CM

## 2018-04-20 DIAGNOSIS — N898 Other specified noninflammatory disorders of vagina: Secondary | ICD-10-CM

## 2018-04-20 LAB — URINALYSIS, ROUTINE W REFLEX MICROSCOPIC
BILIRUBIN URINE: NEGATIVE
Glucose, UA: NEGATIVE mg/dL
KETONES UR: NEGATIVE mg/dL
Nitrite: NEGATIVE
PH: 6 (ref 5.0–8.0)
PROTEIN: NEGATIVE mg/dL
Specific Gravity, Urine: 1.025 (ref 1.005–1.030)

## 2018-04-20 LAB — WET PREP, GENITAL
CLUE CELLS WET PREP: NONE SEEN
Sperm: NONE SEEN
Trich, Wet Prep: NONE SEEN
Yeast Wet Prep HPF POC: NONE SEEN

## 2018-04-20 LAB — POC URINE PREG, ED: PREG TEST UR: NEGATIVE

## 2018-04-20 LAB — GC/CHLAMYDIA PROBE AMP (~~LOC~~) NOT AT ARMC
Chlamydia: NEGATIVE
NEISSERIA GONORRHEA: NEGATIVE

## 2018-04-20 MED ORDER — METRONIDAZOLE 500 MG PO TABS: 500.0000 mg | ORAL_TABLET | Freq: Two times a day (BID) | ORAL | 0 refills | Status: DC

## 2018-04-20 MED ORDER — CEPHALEXIN 500 MG PO CAPS: 500.0000 mg | ORAL_CAPSULE | Freq: Two times a day (BID) | ORAL | 0 refills | Status: DC

## 2018-04-20 MED ORDER — FLUCONAZOLE 150 MG PO TABS: 150.0000 mg | ORAL_TABLET | Freq: Once | ORAL | 0 refills | Status: AC

## 2018-04-20 NOTE — ED Triage Notes (Signed)
Pt reports some vaginal itching that started last night.  She now has rash on her inner thigh.  She would like to be tested for STDs.

## 2018-04-20 NOTE — ED Provider Notes (Signed)
[facility] EMERGENCY DEPARTMENT Provider Note   CSN: [id number] Arrival date & time: 04/20/18  0220     History   Chief Complaint Chief Complaint  Patient presents with  . Vaginal Itching    HPI Sonya Schwartz is a 27 y.o. female.  Patient presents to the emergency department for evaluation of vaginal itching.  Patient reports that she just started having the symptoms last night.  She has not noticed any significant vaginal discharge and denies any vaginal bleeding.  She has not noticed any urinary frequency or dysuria.  She did notice a rash on her inner thigh last night would like that looked at as well.     Past Medical History:  Diagnosis Date  . History of chlamydia   . Infection 2011   CHLAMYDIA  . Morbid obesity with BMI of 45.0-49.9, adult [facility])     Patient Active Problem List   Diagnosis Date Noted  . Allergic reaction 03/08/2018  . Irregular menstrual cycle 12/16/2017  . Preventative health care 05/04/2016  . Migraine without aura and with status migrainosus, not intractable 02/25/2016  . Obesity 05/12/2012    Past Surgical History:  Procedure Laterality Date  . HYMENECTOMY    . VAGINA SURGERY     at age 54. for menstrual flow.  . WISDOM TOOTH EXTRACTION     x 4     OB History    Gravida  1   Para  0   Term  0   Preterm  0   AB  0   Living  0     SAB  0   TAB  0   Ectopic  0   Multiple  0   Live Births               Home Medications    Prior to Admission medications   Medication Sig Start Date End Date Taking? Authorizing Provider  cephALEXin (KEFLEX) 500 MG capsule Take 1 capsule (500 mg total) by mouth 2 (two) times daily. 04/20/18   Gilda Crease, MD  fluconazole (DIFLUCAN) 150 MG tablet Take 1 tablet (150 mg total) by mouth once for 1 dose. 04/20/18 04/20/18  Gilda Crease, MD  metroNIDAZOLE (FLAGYL) 500 MG tablet Take 1 tablet (500 mg total) by mouth 2 (two) times daily. One po  bid x 7 days 04/20/18   Gilda Crease, MD  predniSONE (STERAPRED UNI-PAK 21 TAB) 10 MG (21) TBPK tablet Take by mouth daily. Take 6 tabs by mouth daily  for 2 days, then 5 tabs for 2 days, then 4 tabs for 2 days, then 3 tabs for 2 days, 2 tabs for 2 days, then 1 tab by mouth daily for 2 days Patient not taking: Reported on 04/20/2018 03/07/18   Coralyn Mark, NP    Family History Family History  Problem Relation Age of Onset  . Hypertension Mother   . Heart disease Mother   . Arthritis Mother   . Bronchiolitis Brother   . Bronchiolitis Sister     Social History Social History   Tobacco Use  . Smoking status: Never Smoker  . Smokeless tobacco: Never Used  Substance Use Topics  . Alcohol use: Yes    Comment: rare  . Drug use: No     Allergies   Patient has no known allergies.   Review of Systems Review of Systems  Genitourinary: Negative for vaginal bleeding and vaginal discharge.  Skin: Positive for rash.  All other systems reviewed and are negative.    Physical Exam Updated Vital Signs BP (!) 141/78 (BP Location: Right Wrist)   Pulse 73   Temp 98.4 F (36.9 C) (Oral)   Resp 14   Ht 5' (1.524 m)   Wt 119.3 kg (263 lb)   SpO2 100%   BMI 51.36 kg/m   Physical Exam  Constitutional: She is oriented to person, place, and time. She appears well-developed and well-nourished. No distress.  HENT:  Head: Normocephalic and atraumatic.  Right Ear: Hearing normal.  Left Ear: Hearing normal.  Nose: Nose normal.  Mouth/Throat: Oropharynx is clear and moist and mucous membranes are normal.  Eyes: Pupils are equal, round, and reactive to light. Conjunctivae and EOM are normal.  Neck: Normal range of motion. Neck supple.  Cardiovascular: Regular rhythm, S1 normal and S2 normal. Exam reveals no gallop and no friction rub.  No murmur heard. Pulmonary/Chest: Effort normal and breath sounds normal. No respiratory distress. She exhibits no tenderness.  Abdominal:  Soft. Normal appearance and bowel sounds are normal. There is no hepatosplenomegaly. There is no tenderness. There is no rebound, no guarding, no tenderness at McBurney's point and negative Murphy's sign. No hernia.  Genitourinary: Uterus normal. There is no rash, tenderness or lesion on the right labia. There is no rash, tenderness or lesion on the left labia. Cervix exhibits no motion tenderness and no friability. Right adnexum displays no mass, no tenderness and no fullness. Left adnexum displays no mass, no tenderness and no fullness. No erythema, tenderness or bleeding in the vagina. No signs of injury around the vagina. Vaginal discharge (Scant white) found.  Musculoskeletal: Normal range of motion.  Neurological: She is alert and oriented to person, place, and time. She has normal strength. No cranial nerve deficit or sensory deficit. Coordination normal. GCS eye subscore is 4. GCS verbal subscore is 5. GCS motor subscore is 6.  Skin: Skin is warm, dry and intact. No rash noted. No cyanosis.  Psychiatric: She has a normal mood and affect. Her speech is normal and behavior is normal. Thought content normal.  Nursing note and vitals reviewed.    ED Treatments / Results  Labs (all labs ordered are listed, but only abnormal results are displayed) Labs Reviewed  WET PREP, GENITAL - Abnormal; Notable for the following components:      Result Value   WBC, Wet Prep HPF POC MANY (*)    All other components within normal limits  URINALYSIS, ROUTINE W REFLEX MICROSCOPIC - Abnormal; Notable for the following components:   Hgb urine dipstick SMALL (*)    Leukocytes, UA LARGE (*)    Bacteria, UA RARE (*)    All other components within normal limits  POC URINE PREG, ED  GC[facility]CHLAMYDIA PROBE AMP [facility]    EKG None  Radiology No results found.  Procedures Procedures (including critical care time)  Medications Ordered in ED Medications - No data to display   Initial  Impression / Assessment and Plan / ED Course  I have reviewed the triage vital signs and the nursing notes.  Pertinent labs & imaging results that were available during my care of the patient were reviewed by me and considered in my medical decision making (see chart for details).     Patient presents to the ER for evaluation of vaginal irritation.  Symptoms began yesterday.  She was concerned about a possible rash on her inner thigh, but the area that she indicates  has no evidence of rash or any concerning findings.  Pelvic exam was largely unremarkable as well.  Urinalysis does suggest possible infection.  Will treat with 3-day course of Keflex, empirically give Diflucan and Flagyl for wet prep with white cells  Final Clinical Impressions(s) / ED Diagnoses   Final diagnoses:  Vaginal itching  Urinary tract infection without hematuria, site unspecified    ED Discharge Orders        Ordered    cephALEXin (KEFLEX) 500 MG capsule  2 times daily     04/20/18 0613    fluconazole (DIFLUCAN) 150 MG tablet   Once     04/20/18 0613    metroNIDAZOLE (FLAGYL) 500 MG tablet  2 times daily     04/20/18 1610       Gilda Crease, MD 04/20/18 [phone]

## 2020-01-12 ENCOUNTER — Ambulatory Visit: Payer: Medicaid Other

## 2022-02-07 DIAGNOSIS — N9089 Other specified noninflammatory disorders of vulva and perineum: Secondary | ICD-10-CM | POA: Diagnosis not present

## 2022-02-07 DIAGNOSIS — R69 Illness, unspecified: Secondary | ICD-10-CM | POA: Diagnosis not present

## 2022-02-07 DIAGNOSIS — N898 Other specified noninflammatory disorders of vagina: Secondary | ICD-10-CM | POA: Diagnosis not present

## 2022-02-08 DIAGNOSIS — R69 Illness, unspecified: Secondary | ICD-10-CM | POA: Diagnosis not present

## 2022-02-08 DIAGNOSIS — N898 Other specified noninflammatory disorders of vagina: Secondary | ICD-10-CM | POA: Diagnosis not present

## 2022-04-08 DIAGNOSIS — Z01411 Encounter for gynecological examination (general) (routine) with abnormal findings: Secondary | ICD-10-CM | POA: Diagnosis not present

## 2022-04-08 DIAGNOSIS — Z3009 Encounter for other general counseling and advice on contraception: Secondary | ICD-10-CM | POA: Diagnosis not present

## 2022-04-08 DIAGNOSIS — Z130 Encounter for screening for diseases of the blood and blood-forming organs and certain disorders involving the immune mechanism: Secondary | ICD-10-CM | POA: Diagnosis not present

## 2022-04-08 DIAGNOSIS — Z3169 Encounter for other general counseling and advice on procreation: Secondary | ICD-10-CM | POA: Diagnosis not present

## 2022-04-08 DIAGNOSIS — Z1389 Encounter for screening for other disorder: Secondary | ICD-10-CM | POA: Diagnosis not present

## 2022-04-08 DIAGNOSIS — R69 Illness, unspecified: Secondary | ICD-10-CM | POA: Diagnosis not present

## 2022-04-08 DIAGNOSIS — Z124 Encounter for screening for malignant neoplasm of cervix: Secondary | ICD-10-CM | POA: Diagnosis not present

## 2022-05-10 DIAGNOSIS — M79602 Pain in left arm: Secondary | ICD-10-CM | POA: Diagnosis not present

## 2022-05-10 DIAGNOSIS — Z6841 Body Mass Index (BMI) 40.0 and over, adult: Secondary | ICD-10-CM | POA: Diagnosis not present

## 2022-06-17 DIAGNOSIS — Z6841 Body Mass Index (BMI) 40.0 and over, adult: Secondary | ICD-10-CM | POA: Diagnosis not present

## 2022-07-26 DIAGNOSIS — Z6841 Body Mass Index (BMI) 40.0 and over, adult: Secondary | ICD-10-CM | POA: Diagnosis not present

## 2022-08-17 DIAGNOSIS — R109 Unspecified abdominal pain: Secondary | ICD-10-CM | POA: Diagnosis not present

## 2022-08-17 DIAGNOSIS — N3001 Acute cystitis with hematuria: Secondary | ICD-10-CM | POA: Diagnosis not present

## 2022-08-22 ENCOUNTER — Encounter (HOSPITAL_BASED_OUTPATIENT_CLINIC_OR_DEPARTMENT_OTHER): Payer: Self-pay

## 2022-08-22 ENCOUNTER — Emergency Department (HOSPITAL_BASED_OUTPATIENT_CLINIC_OR_DEPARTMENT_OTHER)
Admission: EM | Admit: 2022-08-22 | Discharge: 2022-08-23 | Disposition: A | Discharge status: Home / Self Care | Payer: 59 | Attending: Emergency Medicine | Admitting: Emergency Medicine

## 2022-08-22 ENCOUNTER — Other Ambulatory Visit: Payer: Self-pay

## 2022-08-22 ENCOUNTER — Emergency Department (HOSPITAL_BASED_OUTPATIENT_CLINIC_OR_DEPARTMENT_OTHER): Payer: 59

## 2022-08-22 DIAGNOSIS — N730 Acute parametritis and pelvic cellulitis: Secondary | ICD-10-CM

## 2022-08-22 DIAGNOSIS — R1084 Generalized abdominal pain: Secondary | ICD-10-CM | POA: Diagnosis not present

## 2022-08-22 DIAGNOSIS — R1030 Lower abdominal pain, unspecified: Secondary | ICD-10-CM | POA: Diagnosis not present

## 2022-08-22 DIAGNOSIS — R0689 Other abnormalities of breathing: Secondary | ICD-10-CM | POA: Diagnosis not present

## 2022-08-22 DIAGNOSIS — R9431 Abnormal electrocardiogram [ECG] [EKG]: Secondary | ICD-10-CM | POA: Diagnosis not present

## 2022-08-22 DIAGNOSIS — N739 Female pelvic inflammatory disease, unspecified: Secondary | ICD-10-CM | POA: Insufficient documentation

## 2022-08-22 DIAGNOSIS — N390 Urinary tract infection, site not specified: Secondary | ICD-10-CM | POA: Diagnosis not present

## 2022-08-22 DIAGNOSIS — T68XXXA Hypothermia, initial encounter: Secondary | ICD-10-CM | POA: Diagnosis not present

## 2022-08-22 DIAGNOSIS — I959 Hypotension, unspecified: Secondary | ICD-10-CM | POA: Diagnosis not present

## 2022-08-22 LAB — URINALYSIS, ROUTINE W REFLEX MICROSCOPIC
Bilirubin Urine: NEGATIVE
Glucose, UA: NEGATIVE mg/dL
Hgb urine dipstick: NEGATIVE
Ketones, ur: NEGATIVE mg/dL
Nitrite: NEGATIVE
Specific Gravity, Urine: 1.023 (ref 1.005–1.030)
WBC, UA: >50 WBC/hpf — ABNORMAL HIGH (ref 0–5)
pH: 7 (ref 5.0–8.0)

## 2022-08-22 LAB — CBC
HCT: 37.6 % (ref 36.0–46.0)
Hemoglobin: 12.5 g/dL (ref 12.0–15.0)
MCH: 29.3 pg (ref 26.0–34.0)
MCHC: 33.2 g/dL (ref 30.0–36.0)
MCV: 88.1 fL (ref 80.0–100.0)
Platelets: 260 10*3/uL (ref 150–400)
RBC: 4.27 MIL/uL (ref 3.87–5.11)
RDW: 13 % (ref 11.5–15.5)
WBC: 10.7 10*3/uL — ABNORMAL HIGH (ref 4.0–10.5)
nRBC: 0 % (ref 0.0–0.2)

## 2022-08-22 LAB — COMPREHENSIVE METABOLIC PANEL
ALT: 11 U/L (ref 0–44)
AST: 14 U/L — ABNORMAL LOW (ref 15–41)
Albumin: 4.1 g/dL (ref 3.5–5.0)
Alkaline Phosphatase: 39 U/L (ref 38–126)
Anion gap: 9 (ref 5–15)
BUN: 12 mg/dL (ref 6–20)
CO2: 26 mmol/L (ref 22–32)
Calcium: 8.8 mg/dL — ABNORMAL LOW (ref 8.9–10.3)
Chloride: 101 mmol/L (ref 98–111)
Creatinine, Ser: 0.66 mg/dL (ref 0.44–1.00)
GFR, Estimated: >60 mL/min (ref >60)
Glucose, Bld: 107 mg/dL — ABNORMAL HIGH (ref 70–99)
Potassium: 3.8 mmol/L (ref 3.5–5.1)
Sodium: 136 mmol/L (ref 135–145)
Total Bilirubin: 0.5 mg/dL (ref 0.3–1.2)
Total Protein: 7.4 g/dL (ref 6.5–8.1)

## 2022-08-22 LAB — WET PREP, GENITAL
Clue Cells Wet Prep HPF POC: NONE SEEN
Sperm: NONE SEEN
Trich, Wet Prep: NONE SEEN
WBC, Wet Prep HPF POC: <10 (ref <10)
Yeast Wet Prep HPF POC: NONE SEEN

## 2022-08-22 LAB — LIPASE, BLOOD: Lipase: 12 U/L (ref 11–51)

## 2022-08-22 LAB — PREGNANCY, URINE: Preg Test, Ur: NEGATIVE

## 2022-08-22 MED ORDER — MORPHINE SULFATE (PF) 4 MG/ML IV SOLN
4.0000 mg | Freq: Once | INTRAVENOUS | Status: AC
  Administered 2022-08-22: 4 mg via INTRAVENOUS
  Filled 2022-08-22: qty 1

## 2022-08-22 MED ORDER — METRONIDAZOLE 500 MG PO TABS: 500.0000 mg | ORAL_TABLET | Freq: Two times a day (BID) | ORAL | 0 refills | Status: AC

## 2022-08-22 MED ORDER — IOHEXOL 300 MG/ML  SOLN
100.0000 mL | Freq: Once | INTRAMUSCULAR | Status: AC | PRN
  Administered 2022-08-22: 80 mL via INTRAVENOUS

## 2022-08-22 MED ORDER — DOXYCYCLINE HYCLATE 100 MG PO CAPS: 100.0000 mg | ORAL_CAPSULE | Freq: Two times a day (BID) | ORAL | 0 refills | Status: AC

## 2022-08-22 MED ORDER — DOXYCYCLINE HYCLATE 100 MG PO TABS
100.0000 mg | ORAL_TABLET | Freq: Once | ORAL | Status: AC
  Administered 2022-08-22: 100 mg via ORAL
  Filled 2022-08-22: qty 1

## 2022-08-22 MED ORDER — METRONIDAZOLE 500 MG PO TABS
500.0000 mg | ORAL_TABLET | Freq: Once | ORAL | Status: AC
  Administered 2022-08-22: 500 mg via ORAL
  Filled 2022-08-22: qty 1

## 2022-08-22 MED ORDER — SODIUM CHLORIDE 0.9 % IV SOLN
1.0000 g | Freq: Once | INTRAVENOUS | Status: AC
  Administered 2022-08-22: 1 g via INTRAVENOUS
  Filled 2022-08-22: qty 10

## 2022-08-22 MED ORDER — CEFPODOXIME PROXETIL 200 MG PO TABS: 200.0000 mg | ORAL_TABLET | Freq: Two times a day (BID) | ORAL | 0 refills | Status: AC

## 2022-08-22 MED ORDER — LACTATED RINGERS IV BOLUS
1000.0000 mL | Freq: Once | INTRAVENOUS | Status: AC
  Administered 2022-08-22: 1000 mL via INTRAVENOUS

## 2022-08-22 MED ORDER — ONDANSETRON HCL 4 MG/2ML IJ SOLN
4.0000 mg | Freq: Once | INTRAMUSCULAR | Status: AC
  Administered 2022-08-22: 4 mg via INTRAVENOUS
  Filled 2022-08-22: qty 2

## 2022-08-22 NOTE — Discharge Instructions (Signed)
Today you were seen in the emergency department for your lower abdominal pain.    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  You are also found to possibly have a urinary tract infection.  At home, please take the antibiotics that we have prescribed you to treat this condition as it can be severe if left untreated.  We have also given you antibiotics to treat you for possible urinary tract infection.    Check your MyChart online for the results of any tests that had not resulted by the time you left the emergency department.   Follow-up with your OBGYN doctor in 2-3 days regarding your visit.    Return immediately to the emergency department if you experience any of the following: High fevers, worsening pain, or any other concerning symptoms.    Thank you for visiting our Emergency Department. It was a pleasure taking care of you today.

## 2022-08-22 NOTE — ED Notes (Addendum)
Attempted to stick pt for labs x2 without success.

## 2022-08-22 NOTE — ED Triage Notes (Signed)
Pt BIB [facility] with BIL lower abd pain radiating to both flanks since 0800 this morning. Pt complains of nausea/dry heaves.

## 2022-08-22 NOTE — ED Provider Notes (Signed)
[facility] EMERGENCY DEPT Provider Note   CSN: [id number] Arrival date & time: 08/22/22  1758     History {Add pertinent medical, surgical, social history, OB history to HPI:1} Chief Complaint  Patient presents with   Abdominal Pain    Sonya Schwartz is a 31 y.o. female.  30 year old female who presents the emergency department with abdominal pain.  Says that this morning she started experiencing bilateral lower abdominal pain.  No known exacerbating or alleviating factors aside from having a bowel movement.  No blood or mucus in her stool recently.  No diarrhea.  Has been having dry heaves but no actual vomiting.  Did have a syncopal episode today as well that prompted her to come into the emergency department.  No history of abdominal surgeries.  On the seventh of this month had similar symptoms and was treated for UTI with Keflex that she finished today.  Was tested for gonorrhea and chlamydia that were negative.  Says that she thinks she may have had vaginal discharge recently and had pain while having sex today which was abnormal for her.  Is having urinary frequency but no urgency and denies any fevers.       Home Medications Prior to Admission medications   Medication Sig Start Date End Date Taking? Authorizing Provider  cephALEXin (KEFLEX) 500 MG capsule Take 1 capsule (500 mg total) by mouth 2 (two) times daily. 04/20/18   Gilda Crease, MD  metroNIDAZOLE (FLAGYL) 500 MG tablet Take 1 tablet (500 mg total) by mouth 2 (two) times daily. One po bid x 7 days 04/20/18   Gilda Crease, MD  predniSONE (STERAPRED UNI-PAK 21 TAB) 10 MG (21) TBPK tablet Take by mouth daily. Take 6 tabs by mouth daily  for 2 days, then 5 tabs for 2 days, then 4 tabs for 2 days, then 3 tabs for 2 days, 2 tabs for 2 days, then 1 tab by mouth daily for 2 days Patient not taking: Reported on 04/20/2018 03/07/18   Coralyn Mark, NP      Allergies    Lavender oil and  Meloxicam    Review of Systems   Review of Systems  Physical Exam Updated Vital Signs BP 108/64   Pulse 71   Temp 97.9 F (36.6 C) (Oral)   Resp 18   Ht 5' (1.524 m)   Wt 116.1 kg   LMP 08/01/2022 (Approximate)   SpO2 100%   BMI 50.00 kg/m  Physical Exam Vitals and nursing note reviewed.  Constitutional:      General: She is not in acute distress.    Appearance: She is well-developed.  HENT:     Head: Normocephalic and atraumatic.     Right Ear: External ear normal.     Left Ear: External ear normal.     Nose: Nose normal.  Eyes:     Extraocular Movements: Extraocular movements intact.     Conjunctiva/sclera: Conjunctivae normal.     Pupils: Pupils are equal, round, and reactive to light.  Cardiovascular:     Rate and Rhythm: Normal rate and regular rhythm.     Heart sounds: No murmur heard. Pulmonary:     Effort: Pulmonary effort is normal. No respiratory distress.     Breath sounds: Normal breath sounds.  Abdominal:     General: Abdomen is flat. There is no distension.     Palpations: Abdomen is soft. There is no mass.     Tenderness: There is abdominal tenderness (Epigastrium,  right lower quadrant, left lower quadrant). There is no guarding.  Musculoskeletal:        General: No swelling.     Cervical back: Normal range of motion and neck supple.     Right lower leg: No edema.     Left lower leg: No edema.  Skin:    General: Skin is warm and dry.     Capillary Refill: Capillary refill takes less than 2 seconds.  Neurological:     Mental Status: She is alert and oriented to person, place, and time. Mental status is at baseline.  Psychiatric:        Mood and Affect: Mood normal.     ED Results / Procedures / Treatments   Labs (all labs ordered are listed, but only abnormal results are displayed) Labs Reviewed  URINALYSIS, ROUTINE W REFLEX MICROSCOPIC - Abnormal; Notable for the following components:      Result Value   Protein, ur TRACE (*)     Leukocytes,Ua LARGE (*)    WBC, UA >50 (*)    All other components within normal limits  PREGNANCY, URINE  LIPASE, BLOOD  COMPREHENSIVE METABOLIC PANEL  CBC    EKG None  Radiology No results found.  Procedures Procedures  {Document cardiac monitor, telemetry assessment procedure when appropriate:1}  Medications Ordered in ED Medications - No data to display  ED Course/ Medical Decision Making/ A&P                           Medical Decision Making Amount and/or Complexity of Data Reviewed Labs: ordered.  Risk Prescription drug management.   ***  {Document critical care time when appropriate:1} {Document review of labs and clinical decision tools ie heart score, Chads2Vasc2 etc:1}  {Document your independent review of radiology images, and any outside records:1} {Document your discussion with family members, caretakers, and with consultants:1} {Document social determinants of health affecting pt's care:1} {Document your decision making why or why not admission, treatments were needed:1} Final Clinical Impression(s) / ED Diagnoses Final diagnoses:  None    Rx / DC Orders ED Discharge Orders     None

## 2022-08-24 DIAGNOSIS — N900 Mild vulvar dysplasia: Secondary | ICD-10-CM | POA: Diagnosis not present

## 2022-08-24 DIAGNOSIS — R102 Pelvic and perineal pain: Secondary | ICD-10-CM | POA: Diagnosis not present

## 2022-08-24 DIAGNOSIS — N7011 Chronic salpingitis: Secondary | ICD-10-CM | POA: Diagnosis not present

## 2022-08-24 LAB — URINE CULTURE

## 2022-08-24 LAB — GC/CHLAMYDIA PROBE AMP (~~LOC~~) NOT AT ARMC
Chlamydia: NEGATIVE
Comment: NEGATIVE
Comment: NORMAL
Neisseria Gonorrhea: NEGATIVE

## 2022-09-28 DIAGNOSIS — N7011 Chronic salpingitis: Secondary | ICD-10-CM | POA: Diagnosis not present

## 2022-10-26 ENCOUNTER — Other Ambulatory Visit: Payer: Self-pay | Admitting: Obstetrics and Gynecology

## 2022-10-26 DIAGNOSIS — N7011 Chronic salpingitis: Secondary | ICD-10-CM

## 2023-01-24 DIAGNOSIS — Z113 Encounter for screening for infections with a predominantly sexual mode of transmission: Secondary | ICD-10-CM | POA: Diagnosis not present

## 2023-02-21 ENCOUNTER — Ambulatory Visit
Admission: RE | Admit: 2023-02-21 | Discharge: 2023-02-21 | Disposition: A | Discharge status: Home / Self Care | Payer: 59 | Source: Ambulatory Visit | Attending: Obstetrics and Gynecology | Admitting: Obstetrics and Gynecology

## 2023-02-21 DIAGNOSIS — N7011 Chronic salpingitis: Secondary | ICD-10-CM | POA: Diagnosis not present

## 2023-03-14 DIAGNOSIS — L309 Dermatitis, unspecified: Secondary | ICD-10-CM | POA: Diagnosis not present

## 2023-04-11 DIAGNOSIS — M9901 Segmental and somatic dysfunction of cervical region: Secondary | ICD-10-CM | POA: Diagnosis not present

## 2023-04-11 DIAGNOSIS — R293 Abnormal posture: Secondary | ICD-10-CM | POA: Diagnosis not present

## 2023-04-11 DIAGNOSIS — M25511 Pain in right shoulder: Secondary | ICD-10-CM | POA: Diagnosis not present

## 2023-04-11 DIAGNOSIS — M9902 Segmental and somatic dysfunction of thoracic region: Secondary | ICD-10-CM | POA: Diagnosis not present

## 2023-04-11 DIAGNOSIS — M47816 Spondylosis without myelopathy or radiculopathy, lumbar region: Secondary | ICD-10-CM | POA: Diagnosis not present

## 2023-04-11 DIAGNOSIS — M25512 Pain in left shoulder: Secondary | ICD-10-CM | POA: Diagnosis not present

## 2023-04-11 DIAGNOSIS — M4723 Other spondylosis with radiculopathy, cervicothoracic region: Secondary | ICD-10-CM | POA: Diagnosis not present

## 2023-04-11 DIAGNOSIS — M9905 Segmental and somatic dysfunction of pelvic region: Secondary | ICD-10-CM | POA: Diagnosis not present

## 2023-04-11 DIAGNOSIS — M9903 Segmental and somatic dysfunction of lumbar region: Secondary | ICD-10-CM | POA: Diagnosis not present

## 2023-05-05 DIAGNOSIS — N7011 Chronic salpingitis: Secondary | ICD-10-CM | POA: Diagnosis not present

## 2023-05-05 DIAGNOSIS — N971 Female infertility of tubal origin: Secondary | ICD-10-CM | POA: Diagnosis not present

## 2023-05-09 DIAGNOSIS — R200 Anesthesia of skin: Secondary | ICD-10-CM | POA: Diagnosis not present

## 2023-05-09 DIAGNOSIS — R5383 Other fatigue: Secondary | ICD-10-CM | POA: Diagnosis not present

## 2023-05-09 DIAGNOSIS — R202 Paresthesia of skin: Secondary | ICD-10-CM | POA: Diagnosis not present

## 2023-05-10 DIAGNOSIS — R5383 Other fatigue: Secondary | ICD-10-CM | POA: Diagnosis not present

## 2023-05-25 DIAGNOSIS — R200 Anesthesia of skin: Secondary | ICD-10-CM | POA: Diagnosis not present

## 2023-05-25 DIAGNOSIS — M79642 Pain in left hand: Secondary | ICD-10-CM | POA: Diagnosis not present

## 2023-05-25 DIAGNOSIS — R202 Paresthesia of skin: Secondary | ICD-10-CM | POA: Diagnosis not present

## 2023-06-06 DIAGNOSIS — N971 Female infertility of tubal origin: Secondary | ICD-10-CM | POA: Diagnosis not present

## 2023-06-06 DIAGNOSIS — Z31430 Encounter of female for testing for genetic disease carrier status for procreative management: Secondary | ICD-10-CM | POA: Diagnosis not present

## 2023-06-06 DIAGNOSIS — E669 Obesity, unspecified: Secondary | ICD-10-CM | POA: Diagnosis not present

## 2023-06-06 DIAGNOSIS — N711 Chronic inflammatory disease of uterus: Secondary | ICD-10-CM | POA: Diagnosis not present

## 2023-06-06 DIAGNOSIS — E559 Vitamin D deficiency, unspecified: Secondary | ICD-10-CM | POA: Diagnosis not present

## 2023-06-06 DIAGNOSIS — E282 Polycystic ovarian syndrome: Secondary | ICD-10-CM | POA: Diagnosis not present

## 2023-06-06 DIAGNOSIS — N7011 Chronic salpingitis: Secondary | ICD-10-CM | POA: Diagnosis not present

## 2023-07-11 DIAGNOSIS — Z1159 Encounter for screening for other viral diseases: Secondary | ICD-10-CM | POA: Diagnosis not present

## 2023-07-11 DIAGNOSIS — Z0000 Encounter for general adult medical examination without abnormal findings: Secondary | ICD-10-CM | POA: Diagnosis not present

## 2023-07-11 DIAGNOSIS — N926 Irregular menstruation, unspecified: Secondary | ICD-10-CM | POA: Diagnosis not present

## 2023-07-11 DIAGNOSIS — Z13220 Encounter for screening for lipoid disorders: Secondary | ICD-10-CM | POA: Diagnosis not present

## 2023-07-11 DIAGNOSIS — Z124 Encounter for screening for malignant neoplasm of cervix: Secondary | ICD-10-CM | POA: Diagnosis not present

## 2023-07-13 DIAGNOSIS — G5603 Carpal tunnel syndrome, bilateral upper limbs: Secondary | ICD-10-CM | POA: Diagnosis not present

## 2023-07-21 ENCOUNTER — Encounter (HOSPITAL_BASED_OUTPATIENT_CLINIC_OR_DEPARTMENT_OTHER): Payer: Self-pay

## 2023-07-21 ENCOUNTER — Emergency Department (HOSPITAL_BASED_OUTPATIENT_CLINIC_OR_DEPARTMENT_OTHER)
Admission: EM | Admit: 2023-07-21 | Discharge: 2023-07-21 | Disposition: A | Discharge status: Home / Self Care | Payer: 59 | Attending: Emergency Medicine | Admitting: Emergency Medicine

## 2023-07-21 ENCOUNTER — Other Ambulatory Visit: Payer: Self-pay

## 2023-07-21 DIAGNOSIS — W57XXXA Bitten or stung by nonvenomous insect and other nonvenomous arthropods, initial encounter: Secondary | ICD-10-CM | POA: Diagnosis not present

## 2023-07-21 DIAGNOSIS — S0086XA Insect bite (nonvenomous) of other part of head, initial encounter: Secondary | ICD-10-CM | POA: Insufficient documentation

## 2023-07-21 MED ORDER — DEXAMETHASONE 4 MG PO TABS
10.0000 mg | ORAL_TABLET | Freq: Once | ORAL | Status: AC
  Administered 2023-07-21: 10 mg via ORAL
  Filled 2023-07-21: qty 3

## 2023-07-21 NOTE — ED Notes (Signed)
Pt noticed ? Insect bite to her rt. Eye yesterday.  Some itching, then noticed more swelling to rt. Side of face this am.

## 2023-07-21 NOTE — ED Provider Notes (Signed)
  [place] EMERGENCY DEPARTMENT AT [facility] Provider Note   CSN: [id number] Arrival date & time: 07/21/23  4098     History  Chief Complaint  Patient presents with   Facial Swelling    Sonya Schwartz is a 32 y.o. female.  The history is provided by the patient.  She was bitten by an insect yesterday morning.  The bite was just beneath her right eye.  Since then, she has noted swelling of the lower lid and itching.  She denies pain and denies any difficulty with her vision.   Home Medications Prior to Admission medications   Medication Sig Start Date End Date Taking? Authorizing Provider  cephALEXin (KEFLEX) 500 MG capsule Take 1 capsule (500 mg total) by mouth 2 (two) times daily. 04/20/18   Gilda Crease, MD  predniSONE (STERAPRED UNI-PAK 21 TAB) 10 MG (21) TBPK tablet Take by mouth daily. Take 6 tabs by mouth daily  for 2 days, then 5 tabs for 2 days, then 4 tabs for 2 days, then 3 tabs for 2 days, 2 tabs for 2 days, then 1 tab by mouth daily for 2 days Patient not taking: Reported on 04/20/2018 03/07/18   Coralyn Mark, NP      Allergies    Lavender oil and Meloxicam    Review of Systems   Review of Systems  All other systems reviewed and are negative.   Physical Exam Updated Vital Signs BP 124/80   Pulse 62   Temp 98 F (36.7 C) (Oral)   Resp 16   SpO2 98%  Physical Exam Vitals and nursing note reviewed.   32 year old female, resting comfortably and in no acute distress. Vital signs are normal. Oxygen saturation is 98%, which is normal. Head is normocephalic and atraumatic. PERRLA, EOMI. There is erythema and soft tissue swelling in the right lower lid and infraorbital area.  There is no warmth.  Appearance is consistent with arthropod bite/sting.  There is no injection of the cornea or sclera, no pain with extraocular movements. Neck is nontender and supple without adenopathy. Lungs are clear without rales, wheezes, or  rhonchi. Chest is nontender. Heart has regular rate and rhythm. Skin is warm and dry without other rash. Neurologic: Mental status is normal, cranial nerves are intact, moves all extremities equally.  ED Results / Procedures / Treatments    Procedures Procedures    Medications Ordered in ED Medications  dexamethasone (DECADRON) tablet 10 mg (has no administration in time range)    ED Course/ Medical Decision Making/ A&P                                 Medical Decision Making Risk Prescription drug management.   Arthropod bite or sting in the right lower eyelid.  No evidence of cellulitis.  I have ordered a dose of dexamethasone and I have discharged the patient with instructions to apply ice and use over-the-counter diphenhydramine and hydrocortisone cream as needed.  Final Clinical Impression(s) / ED Diagnoses Final diagnoses:  Insect bite of face, initial encounter    Rx / DC Orders ED Discharge Orders     None         Dione Booze, MD 07/21/23 [phone]

## 2023-07-21 NOTE — Discharge Instructions (Signed)
Apply ice for thirty minutes at a time, four times a day.  Take diphenhydramine (Benadryl) as needed for itching.  Apply hydrocortisone cream twice a day.

## 2023-07-21 NOTE — ED Triage Notes (Signed)
Pt states that her R eye has been swelling since yesterday, started off as what she thought was a mosquito bite, eye is itchy and swollen.

## 2023-08-02 ENCOUNTER — Other Ambulatory Visit: Payer: Self-pay | Admitting: Orthopedic Surgery

## 2023-08-03 DIAGNOSIS — M2142 Flat foot [pes planus] (acquired), left foot: Secondary | ICD-10-CM | POA: Diagnosis not present

## 2023-08-03 DIAGNOSIS — M21962 Unspecified acquired deformity of left lower leg: Secondary | ICD-10-CM | POA: Diagnosis not present

## 2023-08-03 DIAGNOSIS — M2141 Flat foot [pes planus] (acquired), right foot: Secondary | ICD-10-CM | POA: Diagnosis not present

## 2023-08-03 DIAGNOSIS — M21961 Unspecified acquired deformity of right lower leg: Secondary | ICD-10-CM | POA: Diagnosis not present

## 2023-08-29 ENCOUNTER — Encounter (HOSPITAL_BASED_OUTPATIENT_CLINIC_OR_DEPARTMENT_OTHER): Payer: Self-pay | Admitting: Orthopedic Surgery

## 2023-08-29 ENCOUNTER — Other Ambulatory Visit: Payer: Self-pay

## 2023-08-29 NOTE — Progress Notes (Signed)
   08/29/23 0933  PAT Phone Screen  Is the patient taking a GLP-1 receptor agonist? No  Do You Have Diabetes? No  Do You Have Hypertension? No  Have You Ever Been to the ER for Asthma? No  Have You Taken Oral Steroids in the Past 3 Months? No  Do you Take Phenteramine or any Other Diet Drugs? No  Recent  Lab Work, EKG, CXR? No  Do you have a history of heart problems? No  Any Recent Hospitalizations? No  Height 5' (1.524 m)  Weight 118.8 kg  Pat Appointment Scheduled (S)  Yes (Ht and Wt check/  Possible anesthesia airway eval)

## 2023-09-01 NOTE — Progress Notes (Signed)

## 2023-09-05 ENCOUNTER — Ambulatory Visit (HOSPITAL_BASED_OUTPATIENT_CLINIC_OR_DEPARTMENT_OTHER)
Admission: RE | Admit: 2023-09-05 | Discharge: 2023-09-05 | Disposition: A | Discharge status: Home / Self Care | Payer: 59 | Attending: Orthopedic Surgery | Admitting: Orthopedic Surgery

## 2023-09-05 ENCOUNTER — Ambulatory Visit (HOSPITAL_BASED_OUTPATIENT_CLINIC_OR_DEPARTMENT_OTHER): Payer: 59 | Admitting: Anesthesiology

## 2023-09-05 ENCOUNTER — Other Ambulatory Visit: Payer: Self-pay

## 2023-09-05 ENCOUNTER — Encounter (HOSPITAL_BASED_OUTPATIENT_CLINIC_OR_DEPARTMENT_OTHER): Payer: Self-pay | Admitting: Orthopedic Surgery

## 2023-09-05 ENCOUNTER — Encounter (HOSPITAL_BASED_OUTPATIENT_CLINIC_OR_DEPARTMENT_OTHER)
Admission: RE | Disposition: A | Discharge status: Home / Self Care | Payer: Self-pay | Source: Home / Self Care | Attending: Orthopedic Surgery

## 2023-09-05 DIAGNOSIS — E66813 Obesity, class 3: Secondary | ICD-10-CM | POA: Insufficient documentation

## 2023-09-05 DIAGNOSIS — Z6841 Body Mass Index (BMI) 40.0 and over, adult: Secondary | ICD-10-CM | POA: Diagnosis not present

## 2023-09-05 DIAGNOSIS — G5602 Carpal tunnel syndrome, left upper limb: Secondary | ICD-10-CM | POA: Insufficient documentation

## 2023-09-05 DIAGNOSIS — Z01818 Encounter for other preprocedural examination: Secondary | ICD-10-CM

## 2023-09-05 HISTORY — PX: CARPAL TUNNEL RELEASE: SHX101

## 2023-09-05 LAB — POCT PREGNANCY, URINE: Preg Test, Ur: NEGATIVE

## 2023-09-05 SURGERY — CARPAL TUNNEL RELEASE
Anesthesia: General | Site: Wrist | Laterality: Left

## 2023-09-05 MED ORDER — ACETAMINOPHEN 500 MG PO TABS
1000.0000 mg | ORAL_TABLET | Freq: Once | ORAL | Status: AC
  Administered 2023-09-05: 1000 mg via ORAL

## 2023-09-05 MED ORDER — FENTANYL CITRATE (PF) 250 MCG/5ML IJ SOLN
INTRAMUSCULAR | Status: DC | PRN
  Administered 2023-09-05 (×2): 50 ug via INTRAVENOUS

## 2023-09-05 MED ORDER — FENTANYL CITRATE (PF) 100 MCG/2ML IJ SOLN
INTRAMUSCULAR | Status: AC
  Filled 2023-09-05: qty 2

## 2023-09-05 MED ORDER — LIDOCAINE 2% (20 MG/ML) 5 ML SYRINGE
INTRAMUSCULAR | Status: DC | PRN
  Administered 2023-09-05: 100 mg via INTRAVENOUS

## 2023-09-05 MED ORDER — CEFAZOLIN IN SODIUM CHLORIDE 3-0.9 GM/100ML-% IV SOLN
3.0000 g | INTRAVENOUS | Status: AC
  Administered 2023-09-05: 3 g via INTRAVENOUS

## 2023-09-05 MED ORDER — CEFAZOLIN SODIUM-DEXTROSE 2-4 GM/100ML-% IV SOLN
INTRAVENOUS | Status: AC
  Filled 2023-09-05: qty 100

## 2023-09-05 MED ORDER — TRAMADOL HCL 50 MG PO TABS: ORAL_TABLET | ORAL | 0 refills | Status: AC

## 2023-09-05 MED ORDER — ONDANSETRON HCL 4 MG/2ML IJ SOLN
INTRAMUSCULAR | Status: DC | PRN
  Administered 2023-09-05: 4 mg via INTRAVENOUS

## 2023-09-05 MED ORDER — ACETAMINOPHEN 500 MG PO TABS
ORAL_TABLET | ORAL | Status: AC
  Filled 2023-09-05: qty 2

## 2023-09-05 MED ORDER — OXYCODONE HCL 5 MG PO TABS: 5.0000 mg | ORAL_TABLET | Freq: Once | ORAL | Status: DC | PRN

## 2023-09-05 MED ORDER — FENTANYL CITRATE (PF) 100 MCG/2ML IJ SOLN: 25.0000 ug | INTRAMUSCULAR | Status: DC | PRN

## 2023-09-05 MED ORDER — BUPIVACAINE HCL (PF) 0.25 % IJ SOLN
INTRAMUSCULAR | Status: DC | PRN
  Administered 2023-09-05: 9 mL

## 2023-09-05 MED ORDER — MIDAZOLAM HCL 2 MG/2ML IJ SOLN
INTRAMUSCULAR | Status: AC
  Filled 2023-09-05: qty 2

## 2023-09-05 MED ORDER — LACTATED RINGERS IV SOLN: INTRAVENOUS | Status: DC

## 2023-09-05 MED ORDER — MIDAZOLAM HCL 2 MG/2ML IJ SOLN
INTRAMUSCULAR | Status: DC | PRN
  Administered 2023-09-05: 2 mg via INTRAVENOUS

## 2023-09-05 MED ORDER — CEFAZOLIN SODIUM-DEXTROSE 1-4 GM/50ML-% IV SOLN
INTRAVENOUS | Status: AC
  Filled 2023-09-05: qty 50

## 2023-09-05 MED ORDER — OXYCODONE HCL 5 MG/5ML PO SOLN: 5.0000 mg | Freq: Once | ORAL | Status: DC | PRN

## 2023-09-05 MED ORDER — PROPOFOL 10 MG/ML IV BOLUS
INTRAVENOUS | Status: DC | PRN
  Administered 2023-09-05: 200 mg via INTRAVENOUS

## 2023-09-05 MED ORDER — DEXAMETHASONE SODIUM PHOSPHATE 10 MG/ML IJ SOLN
INTRAMUSCULAR | Status: DC | PRN
  Administered 2023-09-05: 10 mg via INTRAVENOUS

## 2023-09-05 MED ORDER — AMISULPRIDE (ANTIEMETIC) 5 MG/2ML IV SOLN: 10.0000 mg | Freq: Once | INTRAVENOUS | Status: DC | PRN

## 2023-09-05 SURGICAL SUPPLY — 29 items
BLADE SURG 15 STRL LF DISP TIS (BLADE) ×2 IMPLANT
BNDG ELASTIC 3INX 5YD STR LF (GAUZE/BANDAGES/DRESSINGS) ×1 IMPLANT
BNDG ESMARK 4X9 LF (GAUZE/BANDAGES/DRESSINGS) IMPLANT
BNDG GAUZE DERMACEA FLUFF 4 (GAUZE/BANDAGES/DRESSINGS) ×1 IMPLANT
CHLORAPREP W/TINT 26 (MISCELLANEOUS) ×1 IMPLANT
CORD BIPOLAR FORCEPS 12FT (ELECTRODE) ×1 IMPLANT
COVER BACK TABLE 60X90IN (DRAPES) ×1 IMPLANT
COVER MAYO STAND STRL (DRAPES) ×1 IMPLANT
CUFF TOURN SGL QUICK 18X4 (TOURNIQUET CUFF) ×1 IMPLANT
DRAPE EXTREMITY T 121X128X90 (DISPOSABLE) ×1 IMPLANT
DRAPE SURG 17X23 STRL (DRAPES) ×1 IMPLANT
GAUZE PAD ABD 8X10 STRL (GAUZE/BANDAGES/DRESSINGS) ×1 IMPLANT
GAUZE SPONGE 4X4 12PLY STRL (GAUZE/BANDAGES/DRESSINGS) ×1 IMPLANT
GAUZE XEROFORM 1X8 LF (GAUZE/BANDAGES/DRESSINGS) ×1 IMPLANT
GLOVE BIO SURGEON STRL SZ7.5 (GLOVE) ×1 IMPLANT
GLOVE BIOGEL PI IND STRL 8 (GLOVE) ×1 IMPLANT
GOWN STRL REUS W/ TWL LRG LVL3 (GOWN DISPOSABLE) ×1 IMPLANT
GOWN STRL REUS W/TWL XL LVL3 (GOWN DISPOSABLE) ×1 IMPLANT
NDL HYPO 25X1 1.5 SAFETY (NEEDLE) ×1 IMPLANT
NEEDLE HYPO 25X1 1.5 SAFETY (NEEDLE) ×1
NS IRRIG 1000ML POUR BTL (IV SOLUTION) ×1 IMPLANT
PACK BASIN DAY SURGERY FS (CUSTOM PROCEDURE TRAY) ×1 IMPLANT
PADDING CAST ABS COTTON 4X4 ST (CAST SUPPLIES) ×1 IMPLANT
STOCKINETTE 4X48 STRL (DRAPES) ×1 IMPLANT
SUT ETHILON 4 0 PS 2 18 (SUTURE) ×1 IMPLANT
SYR BULB EAR ULCER 3OZ GRN STR (SYRINGE) ×1 IMPLANT
SYR CONTROL 10ML LL (SYRINGE) ×1 IMPLANT
TOWEL GREEN STERILE FF (TOWEL DISPOSABLE) ×2 IMPLANT
UNDERPAD 30X36 HEAVY ABSORB (UNDERPADS AND DIAPERS) ×1 IMPLANT

## 2023-09-05 NOTE — Op Note (Signed)
09/05/2023 [facility]                              OPERATIVE REPORT   PREOPERATIVE DIAGNOSIS:  Left carpal tunnel syndrome.  POSTOPERATIVE DIAGNOSIS:  Left carpal tunnel syndrome.  PROCEDURE:  Left carpal tunnel release.  SURGEON:  Betha Loa, MD  ASSISTANT:  none.  ANESTHESIA: General  IV FLUIDS:  Per anesthesia flow sheet.  ESTIMATED BLOOD LOSS:  Minimal.  COMPLICATIONS:  None.  SPECIMENS:  None.  TOURNIQUET TIME:    Total Tourniquet Time Documented: Forearm (Left) - 13 minutes Total: Forearm (Left) - 13 minutes   DISPOSITION:  Stable to PACU.  LOCATION: [facility]  INDICATIONS:  32 y.o. yo female with numbness and tingling left hand.  Nocturnal symptoms. Positive nerve conduction studies. She wishes to proceed with left carpal tunnel release.  Risks, benefits and alternatives of surgery were discussed including the risk of blood loss; infection; damage to nerves, vessels, tendons, ligaments, bone; failure of surgery; need for additional surgery; complications with wound healing; continued pain; recurrence of carpal tunnel syndrome; and damage to motor branch. She voiced understanding of these risks and elected to proceed.   OPERATIVE COURSE:  After being identified preoperatively by myself, the patient and I agreed upon the procedure and site of procedure.  The surgical site was marked.  Surgical consent had been signed.  She was given IV Ancef as preoperative antibiotic prophylaxis.  She was transferred to the operating room and placed on the operating room table in supine position with the Left upper extremity on an armboard.  General anesthesia was induced by the anesthesiologist.  Left upper extremity was prepped and draped in normal sterile orthopaedic fashion.  A surgical pause was performed between the surgeons, anesthesia, and operating room staff, and all were in agreement as to the patient, procedure, and site of procedure.   Tourniquet at the proximal aspect of the forearm was inflated to 250 mmHg after exsanguination of the arm with an Esmarch bandage  Incision was made over the transverse carpal ligament and carried into the subcutaneous tissues by spreading technique.  Bipolar electrocautery was used to obtain hemostasis.  The palmar fascia was sharply incised.  The transverse carpal ligament was identified.  The fascia distal to the ligament was opened.  Retractor was placed and the flexor tendons were identified.  The flexor tendon to the little finger was identified and retracted radially.  The transverse carpal ligament was then incised from distal to proximal under direct visualization.  Scissors were used to split the distal aspect of the volar antebrachial fascia.  A finger was placed into the wound to ensure complete decompression, which was the case.  The nerve was examined.  It was adherent to the radial leaflet.  The motor branch was identified and was intact.  The wound was copiously irrigated with sterile saline.  It was then closed with 4-0 nylon in a horizontal mattress fashion.  It was injected with 0.25% plain Marcaine to aid in postoperative analgesia.  It was dressed with sterile Xeroform, 4x4s, an ABD, and wrapped with Kerlix and an Ace bandage.  Tourniquet was deflated at 13 minutes.  Fingertips were pink with brisk capillary refill after deflation of the tourniquet.  Operative drapes were broken down.  The patient was awoken from anesthesia safely.  She was transferred back to stretcher and taken to the PACU in stable condition.  I will see her back in the office in 1 week for postoperative followup.  I will give her a prescription for Tramadol 50 mg 1-2 tabs PO q6 hours prn pain, dispense # 20.    Betha Loa, MD Electronically signed, 09/05/23

## 2023-09-05 NOTE — Anesthesia Procedure Notes (Signed)
Procedure Name: LMA Insertion Date/Time: 09/05/2023 1:23 PM  Performed by: Alvera Novel, CRNAPre-anesthesia Checklist: Patient identified, Emergency Drugs available, Suction available and Patient being monitored Patient Re-evaluated:Patient Re-evaluated prior to induction Oxygen Delivery Method: Circle System Utilized Preoxygenation: Pre-oxygenation with 100% oxygen Induction Type: IV induction Ventilation: Mask ventilation without difficulty LMA: LMA inserted LMA Size: 4.0 Number of attempts: 1 Placement Confirmation: positive ETCO2 Tube secured with: Tape Dental Injury: Teeth and Oropharynx as per pre-operative assessment

## 2023-09-05 NOTE — Anesthesia Preprocedure Evaluation (Addendum)
Anesthesia Evaluation  Patient identified by MRN, date of birth, ID band Patient awake    Reviewed: Allergy & Precautions, NPO status , Patient's Chart, lab work & pertinent test results  History of Anesthesia Complications Negative for: history of anesthetic complications  Airway Mallampati: II  TM Distance: >3 FB Neck ROM: Full   Comment: Previous grade I view with [facility] Dental  (+) Dental Advisory Given,    Pulmonary neg pulmonary ROS   Pulmonary exam normal breath sounds clear to auscultation       Cardiovascular negative cardio ROS  Rhythm:Regular Rate:Normal     Neuro/Psych  Headaches, neg Seizures    GI/Hepatic negative GI ROS, Neg liver ROS,,,  Endo/Other  neg diabetes  Class 3 obesity  Renal/GU negative Renal ROS     Musculoskeletal   Abdominal  (+) + obese  Peds  Hematology   Anesthesia Other Findings   Reproductive/Obstetrics                             Anesthesia Physical Anesthesia Plan  ASA: 3  Anesthesia Plan: General   Post-op Pain Management: Tylenol PO (pre-op)*   Induction: Intravenous  PONV Risk Score and Plan: 3 and Ondansetron, Dexamethasone and Treatment may vary due to age or medical condition  Airway Management Planned: LMA  Additional Equipment:   Intra-op Plan:   Post-operative Plan: Extubation in OR  Informed Consent: I have reviewed the patients History and Physical, chart, labs and discussed the procedure including the risks, benefits and alternatives for the proposed anesthesia with the patient or authorized representative who has indicated his/her understanding and acceptance.       Plan Discussed with: CRNA and Anesthesiologist  Anesthesia Plan Comments: (Risks of general anesthesia discussed including, but not limited to, sore throat, hoarse voice, chipped/damaged teeth, injury to vocal cords, nausea and vomiting, allergic reactions,  lung infection, heart attack, stroke, and death. All questions answered. )        Anesthesia Quick Evaluation

## 2023-09-05 NOTE — Transfer of Care (Addendum)
Immediate Anesthesia Transfer of Care Note  Patient: Sonya Schwartz  Procedure(s) Performed: LEFT CARPAL TUNNEL RELEASE (Left: Wrist)  Patient Location: PACU  Anesthesia Type:General  Level of Consciousness: drowsy and patient cooperative  Airway & Oxygen Therapy: Patient Spontanous Breathing and Patient connected to face mask oxygen  Post-op Assessment: Report given to RN and Post -op Vital signs reviewed and stable  Post vital signs: Reviewed and stable  Last Vitals:  Vitals Value Taken Time  BP 130/74   Temp    Pulse 71 09/05/23 1353  Resp 25 09/05/23 1353  SpO2 99 % 09/05/23 1353  Vitals shown include unfiled device data.  Last Pain:  Vitals:   09/05/23 1048  TempSrc: Temporal  PainSc: 4       Patients Stated Pain Goal: 3 (09/05/23 1048)  Complications: No notable events documented.

## 2023-09-05 NOTE — H&P (Signed)
Sonya Schwartz is an 32 y.o. female.   Chief Complaint: carpal tunnel syndrome HPI: 32 y.o. yo female with numbness and tingling left hand.  Nocturnal symptoms. Positive nerve conduction studies. She wishes to have left carpal tunnel release.   Allergies:  Allergies  Allergen Reactions   Lavender Oil Cough, Hives, Itching, Other (See Comments), Rash, Shortness Of Breath and Swelling   Meloxicam Other (See Comments)    Hot flashed and skin feels like its crawling.    Past Medical History:  Diagnosis Date   History of chlamydia    Infection 2011   CHLAMYDIA   Morbid obesity with BMI of 45.0-49.9, adult [facility])     Past Surgical History:  Procedure Laterality Date   ARTHROSCOPIC REPAIR ACL     HYMENECTOMY     WISDOM TOOTH EXTRACTION     x 4    Family History: Family History  Problem Relation Age of Onset   Hypertension Mother    Heart disease Mother    Arthritis Mother    Bronchiolitis Brother    Bronchiolitis Sister     Social History:   reports that she has never smoked. She has never used smokeless tobacco. She reports current alcohol use. She reports that she does not use drugs.  Medications: Medications Prior to Admission  Medication Sig Dispense Refill   ibuprofen (ADVIL) 400 MG tablet Take 400 mg by mouth every 6 (six) hours as needed.     naproxen sodium (ALEVE) 220 MG tablet Take 220 mg by mouth.     Vitamin D, Ergocalciferol, (DRISDOL) 1.25 MG (50000 UNIT) CAPS capsule Take 50,000 Units by mouth every 7 (seven) days.     valACYclovir (VALTREX) 1000 MG tablet Take 1,000 mg by mouth 2 (two) times daily. (Patient not taking: Reported on 08/29/2023)      Results for orders placed or performed during the hospital encounter of 09/05/23 (from the past 48 hour(s))  Pregnancy, urine POC     Status: None   Collection Time: 09/05/23 10:42 AM  Result Value Ref Range   Preg Test, Ur NEGATIVE NEGATIVE    Comment:        THE SENSITIVITY OF THIS METHODOLOGY IS >24  mIU/mL     No results found.    Blood pressure 134/83, pulse 72, temperature (!) 97.3 F (36.3 C), temperature source Temporal, resp. rate 20, height 5' 1.81" (1.57 m), weight 122.9 kg, last menstrual period 08/29/2023, SpO2 100%, unknown if currently breastfeeding.  General appearance: alert, cooperative, and appears stated age Head: Normocephalic, without obvious abnormality, atraumatic Neck: supple, symmetrical, trachea midline Extremities: Intact sensation and capillary refill all digits.  +epl/fpl/io.  No wounds.  Pulses: 2+ and symmetric Skin: Skin color, texture, turgor normal. No rashes or lesions Neurologic: Grossly normal Incision/Wound: none  Assessment/Plan Left carpal tunnel syndrome.  Non operative and operative treatment options have been discussed with the patient and patient wishes to proceed with operative treatment. Risks, benefits and alternatives of surgery were discussed including risks of blood loss, infection, damage to nerves/vessels/tendons/ligament/bone, failure of surgery, need for additional surgery, complication with wound healing, stiffness, recurrence, damage to motor branch.  She voiced understanding of these risks and elected to proceed.    Betha Loa 09/05/2023, 12:59 PM

## 2023-09-05 NOTE — Discharge Instructions (Addendum)
[facility] Instructions Hand Surgery  Wound Care: Keep your hand elevated above the level of your heart.  Do not allow it to dangle by your side.  Keep the dressing dry and do not remove it unless your doctor advises you to do so.  He will usually change it at the time of your post-op visit.  Moving your fingers is advised to stimulate circulation but will depend on the site of your surgery.  If you have a splint applied, your doctor will advise you regarding movement.  Activity: Do not drive or operate machinery today.  Rest today and then you may return to your normal activity and work as indicated by your physician.  Diet:  Drink liquids today or eat a light diet.  You may resume a regular diet tomorrow.    General expectations: Pain for two to three days. Fingers may become slightly swollen.  Call your doctor if any of the following occur: Severe pain not relieved by pain medication. Elevated temperature. Dressing soaked with blood. Inability to move fingers. White or bluish color to fingers.   May take Tylenol after 5pm, if needed.    Post Anesthesia Home Care Instructions  Activity: Get plenty of rest for the remainder of the day. A responsible individual must stay with you for 24 hours following the procedure.  For the next 24 hours, DO NOT: -Drive a car -Advertising copywriter -Drink alcoholic beverages -Take any medication unless instructed by your physician -Make any legal decisions or sign important papers.  Meals: Start with liquid foods such as gelatin or soup. Progress to regular foods as tolerated. Avoid greasy, spicy, heavy foods. If nausea and/or vomiting occur, drink only clear liquids until the nausea and/or vomiting subsides. Call your physician if vomiting continues.  Special Instructions/Symptoms: Your throat may feel dry or sore from the anesthesia or the breathing tube placed in your throat during surgery. If this causes discomfort, gargle with warm salt  water. The discomfort should disappear within 24 hours.  If you had a scopolamine patch placed behind your ear for the management of post- operative nausea and/or vomiting:  1. The medication in the patch is effective for 72 hours, after which it should be removed.  Wrap patch in a tissue and discard in the trash. Wash hands thoroughly with soap and water. 2. You may remove the patch earlier than 72 hours if you experience unpleasant side effects which may include dry mouth, dizziness or visual disturbances. 3. Avoid touching the patch. Wash your hands with soap and water after contact with the patch.

## 2023-09-05 NOTE — Addendum Note (Signed)
Addendum  created 09/05/23 1515 by Alvera Novel, CRNA   Clinical Note Signed

## 2023-09-05 NOTE — Anesthesia Postprocedure Evaluation (Signed)
Anesthesia Post Note  Patient: Sonya Schwartz  Procedure(s) Performed: LEFT CARPAL TUNNEL RELEASE (Left: Wrist)     Patient location during evaluation: PACU Anesthesia Type: General Level of consciousness: awake Pain management: pain level controlled Vital Signs Assessment: post-procedure vital signs reviewed and stable Respiratory status: spontaneous breathing, nonlabored ventilation and respiratory function stable Cardiovascular status: blood pressure returned to baseline and stable Postop Assessment: no apparent nausea or vomiting Anesthetic complications: no   No notable events documented.  Last Vitals:  Vitals:   09/05/23 1415 09/05/23 1429  BP: 130/78 (!) 143/97  Pulse: 62 68  Resp: 15 16  Temp:  (!) 36.2 C  SpO2: 97% 96%    Last Pain:  Vitals:   09/05/23 1429  TempSrc:   PainSc: 0-No pain                 Linton Rump

## 2023-09-06 ENCOUNTER — Encounter (HOSPITAL_BASED_OUTPATIENT_CLINIC_OR_DEPARTMENT_OTHER): Payer: Self-pay | Admitting: Orthopedic Surgery

## 2023-09-12 DIAGNOSIS — G5603 Carpal tunnel syndrome, bilateral upper limbs: Secondary | ICD-10-CM | POA: Diagnosis not present

## 2023-09-19 DIAGNOSIS — F4321 Adjustment disorder with depressed mood: Secondary | ICD-10-CM | POA: Diagnosis not present

## 2023-09-20 DIAGNOSIS — M79642 Pain in left hand: Secondary | ICD-10-CM | POA: Diagnosis not present

## 2023-09-26 DIAGNOSIS — F4321 Adjustment disorder with depressed mood: Secondary | ICD-10-CM | POA: Diagnosis not present

## 2023-10-17 DIAGNOSIS — F4321 Adjustment disorder with depressed mood: Secondary | ICD-10-CM | POA: Diagnosis not present

## 2023-10-17 DIAGNOSIS — G5603 Carpal tunnel syndrome, bilateral upper limbs: Secondary | ICD-10-CM | POA: Diagnosis not present

## 2023-10-24 DIAGNOSIS — F4321 Adjustment disorder with depressed mood: Secondary | ICD-10-CM | POA: Diagnosis not present

## 2023-11-07 DIAGNOSIS — F4321 Adjustment disorder with depressed mood: Secondary | ICD-10-CM | POA: Diagnosis not present

## 2024-01-04 DIAGNOSIS — R238 Other skin changes: Secondary | ICD-10-CM | POA: Diagnosis not present

## 2024-01-04 DIAGNOSIS — Z113 Encounter for screening for infections with a predominantly sexual mode of transmission: Secondary | ICD-10-CM | POA: Diagnosis not present

## 2024-01-04 DIAGNOSIS — R3121 Asymptomatic microscopic hematuria: Secondary | ICD-10-CM | POA: Diagnosis not present

## 2024-03-01 DIAGNOSIS — M25572 Pain in left ankle and joints of left foot: Secondary | ICD-10-CM | POA: Diagnosis not present

## 2024-03-01 DIAGNOSIS — M25571 Pain in right ankle and joints of right foot: Secondary | ICD-10-CM | POA: Diagnosis not present

## 2024-03-01 DIAGNOSIS — M545 Low back pain, unspecified: Secondary | ICD-10-CM | POA: Diagnosis not present

## 2024-03-19 ENCOUNTER — Ambulatory Visit: Admitting: Family Medicine

## 2024-03-19 VITALS — BP 131/72 | Ht 60.0 in | Wt 262.0 lb

## 2024-03-19 DIAGNOSIS — G8929 Other chronic pain: Secondary | ICD-10-CM | POA: Diagnosis not present

## 2024-03-19 DIAGNOSIS — M25572 Pain in left ankle and joints of left foot: Secondary | ICD-10-CM | POA: Diagnosis not present

## 2024-03-19 DIAGNOSIS — M25571 Pain in right ankle and joints of right foot: Secondary | ICD-10-CM | POA: Diagnosis not present

## 2024-03-19 DIAGNOSIS — M2142 Flat foot [pes planus] (acquired), left foot: Secondary | ICD-10-CM | POA: Diagnosis not present

## 2024-03-19 DIAGNOSIS — M2141 Flat foot [pes planus] (acquired), right foot: Secondary | ICD-10-CM | POA: Diagnosis not present

## 2024-03-19 NOTE — Progress Notes (Unsigned)
 PCP: Virl Grimes, PA-C  Subjective:   HPI: Patient is a 33 y.o. female here for custom orthotics.  Patient comes in today as a referral from [facility] for custom orthotics. She's had bilateral ankle pain for several years. Had injections earlier today. Known pes planus. 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  Past Medical History:  Diagnosis Date   History of chlamydia    Infection 2011   CHLAMYDIA   Morbid obesity with BMI of 45.0-49.9, adult [facility])     Current Outpatient Medications on File Prior to Visit  Medication Sig Dispense Refill   ibuprofen (ADVIL) 400 MG tablet Take 400 mg by mouth every 6 (six) hours as needed.     naproxen sodium (ALEVE) 220 MG tablet Take 220 mg by mouth.     traMADol  (ULTRAM ) 50 MG tablet 1-2 tabs PO q6 hours prn pain 20 tablet 0   valACYclovir (VALTREX) 1000 MG tablet Take 1,000 mg by mouth 2 (two) times daily. (Patient not taking: Reported on 08/29/2023)     Vitamin D, Ergocalciferol, (DRISDOL) 1.25 MG (50000 UNIT) CAPS capsule Take 50,000 Units by mouth every 7 (seven) days.     No current facility-administered medications on file prior to visit.    Past Surgical History:  Procedure Laterality Date   ARTHROSCOPIC REPAIR ACL     CARPAL TUNNEL RELEASE Left 09/05/2023   Procedure: LEFT CARPAL TUNNEL RELEASE;  Surgeon: Brunilda Capra, MD;  Location: [facility];  Service: Orthopedics;  Laterality: Left;  30 MIN   HYMENECTOMY     WISDOM TOOTH EXTRACTION     x 4    Allergies  Allergen Reactions   Lavender Oil Cough, Hives, Itching, Other (See Comments), Rash, Shortness Of Breath and Swelling   Meloxicam Other (See Comments)    Hot flashed and skin feels like its crawling.    BP 131/72 (BP Location: Right Arm)   Ht 5' (1.524 m)   Wt 262 lb (118.8 kg)   BMI 51.17 kg/m       No data to display              No data to display              Objective:  Physical Exam:  Gen: NAD, comfortable  in exam room  Bilateral feet/ankles: Notable ankle swelling.  Transverse arch collapse.  Pes planus and calcaneal valgus.  Splaying 1st and 2nd digits.  Callus over dorsal left 5th digit.  No bruising, other deformity. Full range of motion NV intact distally.    Assessment & Plan:  1. Bilateral ankle pain - continue treatment at [facility].  Custom orthotics made as below and felt comfortable.  Patient was fitted for a : standard, cushioned, semi-rigid orthotic. The orthotic was heated and afterward the patient stood on the orthotic blank positioned on the orthotic stand. The patient was positioned in subtalar neutral position and 10 degrees of ankle dorsiflexion in a weight bearing stance. After completion of molding, a stable base was applied to the orthotic blank. The blank was ground to a stable position for weight bearing. Size: 7 fit & run Base: none Posting: none Additional orthotic padding: none

## 2024-05-18 DIAGNOSIS — L710 Perioral dermatitis: Secondary | ICD-10-CM | POA: Diagnosis not present

## 2024-05-18 DIAGNOSIS — L209 Atopic dermatitis, unspecified: Secondary | ICD-10-CM | POA: Diagnosis not present

## 2024-05-18 DIAGNOSIS — R233 Spontaneous ecchymoses: Secondary | ICD-10-CM | POA: Diagnosis not present

## 2024-05-21 DIAGNOSIS — F4389 Other reactions to severe stress: Secondary | ICD-10-CM | POA: Diagnosis not present

## 2024-05-21 DIAGNOSIS — F3289 Other specified depressive episodes: Secondary | ICD-10-CM | POA: Diagnosis not present

## 2024-05-28 DIAGNOSIS — F4389 Other reactions to severe stress: Secondary | ICD-10-CM | POA: Diagnosis not present

## 2024-05-28 DIAGNOSIS — F3289 Other specified depressive episodes: Secondary | ICD-10-CM | POA: Diagnosis not present

## 2024-06-05 DIAGNOSIS — F4389 Other reactions to severe stress: Secondary | ICD-10-CM | POA: Diagnosis not present

## 2024-06-05 DIAGNOSIS — F3289 Other specified depressive episodes: Secondary | ICD-10-CM | POA: Diagnosis not present

## 2024-06-12 DIAGNOSIS — F3289 Other specified depressive episodes: Secondary | ICD-10-CM | POA: Diagnosis not present

## 2024-06-12 DIAGNOSIS — F4389 Other reactions to severe stress: Secondary | ICD-10-CM | POA: Diagnosis not present

## 2024-06-18 DIAGNOSIS — F3289 Other specified depressive episodes: Secondary | ICD-10-CM | POA: Diagnosis not present

## 2024-06-18 DIAGNOSIS — F4389 Other reactions to severe stress: Secondary | ICD-10-CM | POA: Diagnosis not present

## 2024-06-25 DIAGNOSIS — F4389 Other reactions to severe stress: Secondary | ICD-10-CM | POA: Diagnosis not present

## 2024-06-25 DIAGNOSIS — F3289 Other specified depressive episodes: Secondary | ICD-10-CM | POA: Diagnosis not present

## 2024-07-19 DIAGNOSIS — N971 Female infertility of tubal origin: Secondary | ICD-10-CM | POA: Diagnosis not present

## 2024-07-19 DIAGNOSIS — Z113 Encounter for screening for infections with a predominantly sexual mode of transmission: Secondary | ICD-10-CM | POA: Diagnosis not present

## 2024-07-19 DIAGNOSIS — E66813 Obesity, class 3: Secondary | ICD-10-CM | POA: Diagnosis not present

## 2024-07-19 DIAGNOSIS — Z6841 Body Mass Index (BMI) 40.0 and over, adult: Secondary | ICD-10-CM | POA: Diagnosis not present

## 2024-07-19 DIAGNOSIS — N930 Postcoital and contact bleeding: Secondary | ICD-10-CM | POA: Diagnosis not present

## 2024-07-25 DIAGNOSIS — R2243 Localized swelling, mass and lump, lower limb, bilateral: Secondary | ICD-10-CM | POA: Diagnosis not present

## 2024-07-25 NOTE — Progress Notes (Signed)
 [facility]  - Family Medicine Myra Master  Date of Service: 07/25/2024 Patient Name: Sonya Schwartz Patient DOB: [birth year]    Subjective:   Skin Discoloration    HPI Patient comes in for Skin Discoloration Patient states she noticed skin discoloration on her right ankle. Patient states she noticed this while working. 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 Patient has been experiencing swelling with the discoloration of her ankle as well. Patient first noticed these symptoms a month ago. Patient states her ankles are constantly on fire from standing on her feet. Patient states she was recently seen by orthopedics in June and received a cortisone injection on her right leg and ankle. Patient has tried taking aleve or tylenol  to alleviate her symptoms. Patient has tried bio freeze and elevating her legs and feet at night as well.   Review of Systems Review of Systems  Respiratory:  Negative for cough and shortness of breath.   Cardiovascular:  Positive for leg swelling. Negative for chest pain.  Gastrointestinal:  Negative for abdominal pain.  Musculoskeletal:  Positive for joint swelling. Negative for back pain.       Right ankle  Skin:  Positive for color change.       Right ankle discoloration  Neurological:  Negative for headaches.     The following portions of the patient's history were reviewed and updated as appropriate: allergies, current medications, PMH/PSH, past social history and problem list.    Past Medical/Surgical History:   Medical History[1] Surgical History[2]  Family History:   Family History[3]  Social History:   Social History[4] Tobacco Use History[5]   Allergies:   Lavender and Meloxicam  Current Medications:   Current Medications[6]   Objective:   Vital Signs BP 116/88 (BP Location: Left arm, Patient Position: Sitting)   Pulse 63   Temp 97.3 F (36.3 C) (Temporal)   Ht 1.524 m (5')    Wt 119 kg (261 lb 14.4 oz)   LMP 07/20/2024 (Exact Date)   SpO2 96%   BMI 51.15 kg/m   BP Readings from Last 3 Encounters:  07/25/24 116/88  07/19/24 120/80  05/18/24 112/70   Wt Readings from Last 3 Encounters:  07/25/24 119 kg (261 lb 14.4 oz)  07/19/24 119 kg (262 lb)  05/18/24 118 kg (260 lb 14.4 oz)   Patient's last menstrual period was 07/20/2024 (exact date).  Physical Exam Vitals reviewed.  Constitutional:      Appearance: Normal appearance.  Cardiovascular:     Rate and Rhythm: Normal rate and regular rhythm.  Pulmonary:     Effort: Pulmonary effort is normal.     Breath sounds: Normal breath sounds.  Skin:    Comments: Hypopigmentation of right lower leg  Neurological:     Mental Status: She is alert.      Assessment/Plan:   - Skin discoloration is due to previous cortisone injections from June. Reassured pt this should resolve over time  - Anemia profile, CMP, CBC and Vitamin D labs ordered  Jelissa was seen today for skin discoloration.  Diagnoses and all orders for this visit:  Localized swelling of both lower legs -     Comprehensive Metabolic Panel -     Vitamin D, 25-Hydroxy -     Anemia Profile -     CBC with Differential    Patient verbalizes understanding and in agreement with the above plan. All questions answered.    Medication side effects discussed  with patient. Advised patient to call clinic or return for visit if these symptoms occur.   Goals of care discussed with patient including med compliance and adequate follow up.  Return if symptoms worsen or fail to improve.    This document was created using the aid of voice recognition Scientist, clinical (histocompatibility and immunogenetics).   Michae JONETTA Kerns, CMA   This document serves as a record of services personally performed by Tari Banter PA-C.  It was created on their behalf by Michae JONETTA Kerns, CMA, a trained medical scribe, and Certified Medical Assistant (CMA). During the course of documenting the  history, physical exam and medical decision making, I was functioning as a stage manager. The creation of this record is the provider's dictation and/or activities during the visit.  Electronically signed by Michae JONETTA Kerns, CMA 07/25/2024 2:00 PM   I agree the documentation is accurate and complete.  Electronically signed by: Tari ONEIDA Banter, PA-C 08/01/2024 10:01 PM         [1] Past Medical History: Diagnosis Date  . HSV-2 infection   . Rupture of anterior cruciate ligament of left knee 02/14/2020  [2] Past Surgical History: Procedure Laterality Date  . CARPAL TUNNEL RELEASE Left 09/05/2023  . ENDOMETRIAL ABLATION     Procedure: ENDOMETRIAL ABLATION  . KNEE ARTHROSCOPY W/ ACL RECONSTRUCTION Left 03/07/2020   Procedure: KNEE ARTHROSCOPY ACL RECONSTRUCTION Quadcriceps tendon Autograft;  Surgeon: Redell Lamar David, MD;  Location: [phone] MAIN OR;  Service: Orthopedics;  Laterality: Left;  SOS Knee  [3] Family History Problem Relation Name Age of Onset  . Arthritis Mother    . Hypertension Father    . Diabetes Maternal Grandmother    . Anesthesia problems Neg Hx    [4] Social History Socioeconomic History  . Marital status: Single  Tobacco Use  . Smoking status: Never  . Smokeless tobacco: Never  Substance and Sexual Activity  . Alcohol use: Yes  . Drug use: Not Currently  . Sexual activity: Yes    Birth control/protection: None  Social History Narrative   ** Merged History Encounter **       Social Drivers of Health   Food Insecurity: Low Risk  (07/25/2024)   Food vital sign   . Within the past 12 months, you worried that your food would run out before you got money to buy more: Never true   . Within the past 12 months, the food you bought just didn't last and you didn't have money to get more: Never true  Transportation Needs: No Transportation Needs (07/25/2024)   Transportation   . In the past 12 months, has lack of reliable transportation kept you from  medical appointments, meetings, work or from getting things needed for daily living? : No  Living Situation: Low Risk  (07/25/2024)   Living Situation   . What is your living situation today?: I have a steady place to live   . Think about the place you live. Do you have problems with any of the following? Choose all that apply:: None/None on this list  [5] Social History Tobacco Use  Smoking Status Never  Smokeless Tobacco Never  [6] Current Outpatient Medications  Medication Sig Dispense Refill  . ibuprofen (MOTRIN) 400 mg tablet Take 400 mg by mouth every 6 (six) hours as needed.    . valACYclovir (VALTREX) 1 gram tablet Indications: herpes simplex infection. Take 1 g once daily for 5 days.  Initiate treatment preferably within 1 day of lesion onset or  during preceding prodrome 30 tablet 0  . traMADoL  (ULTRAM ) 50 mg tablet TAKE 1 TO 2 TABLETS EVERY 6 HOURS AS NEEDED FOR PAIN (Patient not taking: Reported on 07/25/2024)     No current facility-administered medications for this visit.

## 2024-07-26 DIAGNOSIS — F4389 Other reactions to severe stress: Secondary | ICD-10-CM | POA: Diagnosis not present

## 2024-08-07 DIAGNOSIS — F4389 Other reactions to severe stress: Secondary | ICD-10-CM | POA: Diagnosis not present

## 2024-08-20 DIAGNOSIS — F4389 Other reactions to severe stress: Secondary | ICD-10-CM | POA: Diagnosis not present

## 2024-08-30 ENCOUNTER — Emergency Department (HOSPITAL_COMMUNITY)

## 2024-08-30 ENCOUNTER — Emergency Department (HOSPITAL_COMMUNITY)
Admission: EM | Admit: 2024-08-30 | Discharge: 2024-08-30 | Disposition: A | Discharge status: Home / Self Care | Attending: Emergency Medicine | Admitting: Emergency Medicine

## 2024-08-30 DIAGNOSIS — R42 Dizziness and giddiness: Secondary | ICD-10-CM | POA: Diagnosis not present

## 2024-08-30 DIAGNOSIS — R531 Weakness: Secondary | ICD-10-CM

## 2024-08-30 DIAGNOSIS — R202 Paresthesia of skin: Secondary | ICD-10-CM | POA: Diagnosis not present

## 2024-08-30 DIAGNOSIS — R208 Other disturbances of skin sensation: Secondary | ICD-10-CM | POA: Diagnosis not present

## 2024-08-30 DIAGNOSIS — I10 Essential (primary) hypertension: Secondary | ICD-10-CM | POA: Diagnosis not present

## 2024-08-30 DIAGNOSIS — R55 Syncope and collapse: Secondary | ICD-10-CM | POA: Diagnosis not present

## 2024-08-30 DIAGNOSIS — R200 Anesthesia of skin: Secondary | ICD-10-CM | POA: Insufficient documentation

## 2024-08-30 DIAGNOSIS — R29818 Other symptoms and signs involving the nervous system: Secondary | ICD-10-CM | POA: Diagnosis not present

## 2024-08-30 DIAGNOSIS — R110 Nausea: Secondary | ICD-10-CM | POA: Diagnosis not present

## 2024-08-30 LAB — CBC
HCT: 38.5 % (ref 36.0–46.0)
Hemoglobin: 12.7 g/dL (ref 12.0–15.0)
MCH: 28.7 pg (ref 26.0–34.0)
MCHC: 33 g/dL (ref 30.0–36.0)
MCV: 87.1 fL (ref 80.0–100.0)
Platelets: 317 K/uL (ref 150–400)
RBC: 4.42 MIL/uL (ref 3.87–5.11)
RDW: 12.8 % (ref 11.5–15.5)
WBC: 4.5 K/uL (ref 4.0–10.5)
nRBC: 0 % (ref 0.0–0.2)

## 2024-08-30 LAB — I-STAT CHEM 8, ED
BUN: 7 mg/dL (ref 6–20)
Calcium, Ion: 1.16 mmol/L (ref 1.15–1.40)
Chloride: 103 mmol/L (ref 98–111)
Creatinine, Ser: 0.8 mg/dL (ref 0.44–1.00)
Glucose, Bld: 84 mg/dL (ref 70–99)
HCT: 38 % (ref 36.0–46.0)
Hemoglobin: 12.9 g/dL (ref 12.0–15.0)
Potassium: 3.5 mmol/L (ref 3.5–5.1)
Sodium: 139 mmol/L (ref 135–145)
TCO2: 26 mmol/L (ref 22–32)

## 2024-08-30 LAB — COMPREHENSIVE METABOLIC PANEL WITH GFR
ALT: 17 U/L (ref 0–44)
AST: 22 U/L (ref 15–41)
Albumin: 3.6 g/dL (ref 3.5–5.0)
Alkaline Phosphatase: 43 U/L (ref 38–126)
Anion gap: 12 (ref 5–15)
BUN: 8 mg/dL (ref 6–20)
CO2: 22 mmol/L (ref 22–32)
Calcium: 8.7 mg/dL — ABNORMAL LOW (ref 8.9–10.3)
Chloride: 102 mmol/L (ref 98–111)
Creatinine, Ser: 0.61 mg/dL (ref 0.44–1.00)
GFR, Estimated: >60 mL/min (ref >60)
Glucose, Bld: 87 mg/dL (ref 70–99)
Potassium: 3.6 mmol/L (ref 3.5–5.1)
Sodium: 136 mmol/L (ref 135–145)
Total Bilirubin: 1 mg/dL (ref 0.0–1.2)
Total Protein: 7 g/dL (ref 6.5–8.1)

## 2024-08-30 LAB — ETHANOL: Alcohol, Ethyl (B): <15 mg/dL (ref <15)

## 2024-08-30 LAB — PROTIME-INR
INR: 1 (ref 0.8–1.2)
Prothrombin Time: 13.9 s (ref 11.4–15.2)

## 2024-08-30 LAB — DIFFERENTIAL
Abs Immature Granulocytes: 0.01 K/uL (ref 0.00–0.07)
Basophils Absolute: 0 K/uL (ref 0.0–0.1)
Basophils Relative: 0 %
Eosinophils Absolute: 0.1 K/uL (ref 0.0–0.5)
Eosinophils Relative: 1 %
Immature Granulocytes: 0 %
Lymphocytes Relative: 37 %
Lymphs Abs: 1.7 K/uL (ref 0.7–4.0)
Monocytes Absolute: 0.3 K/uL (ref 0.1–1.0)
Monocytes Relative: 6 %
Neutro Abs: 2.6 K/uL (ref 1.7–7.7)
Neutrophils Relative %: 56 %

## 2024-08-30 LAB — CBG MONITORING, ED: Glucose-Capillary: 85 mg/dL (ref 70–99)

## 2024-08-30 LAB — HCG, SERUM, QUALITATIVE: Preg, Serum: NEGATIVE

## 2024-08-30 LAB — APTT: aPTT: 33 s (ref 24–36)

## 2024-08-30 MED ORDER — SODIUM CHLORIDE 0.9% FLUSH
3.0000 mL | Freq: Once | INTRAVENOUS | Status: AC
  Administered 2024-08-30: 3 mL via INTRAVENOUS

## 2024-08-30 NOTE — Code Documentation (Signed)
 Stroke Response Nurse Documentation Code Documentation  Sonya Schwartz is a 33 y.o. female arriving to [facility] via [facility] coming from Urgent care with symptoms of dizziness and left sided tingling. Patient LKW 0130 and then noted symptoms when she woke up 0230.   Stroke team met pt at bridge, labs unable to be drawn due to difficult stick and no access. NIH 1 for sensory. CT and MRI completed. No TNK due to OOW. No thrombectomy due to no LVO.  Care Plan: q2h NIH and vitals x12 then per unit routine. NPO until swallow screen.  Bedside handoff with ED RN [place].    Update: MRI negative, Code Stroke Cancelled.  Tonna Lacks K  Rapid Response RN

## 2024-08-30 NOTE — Progress Notes (Addendum)
 NEUROLOGY CONSULT NOTE   Date of service: August 30, 2024 Patient Name: Sonya Schwartz MRN:  [id number] DOB:  [birth year] Chief Complaint: Code Stroke  Requesting Provider: No att. providers found  History of Present Illness  Sonya Schwartz is a 33 y.o. female with hx of obesity presenting with dizziness and lightheadedness when she woke up around 0230. She went back to sleep and noticed it still when she woke up again. She went to urgent care and noted that her vision was blurry on the left. She then developed tingling on the left and left sided weakness. The called EMS and she was brought to [facility] ED as a code stroke.   LKW: 11/19  Modified rankin score: 0-Completely asymptomatic and back to baseline post- stroke IV Thrombolysis: No, too mild to treat EVT: No, no LVO    NIHSS components Score: Comment  1a Level of Conscious 0[]  1[]  2[]  3[]      1b LOC Questions 0[]  1[]  2[]       1c LOC Commands 0[]  1[]  2[]       2 Best Gaze 0[]  1[]  2[]       3 Visual 0[]  1[]  2[]  3[]      4 Facial Palsy 0[]  1[]  2[]  3[]      5a Motor Arm - left 0[]  1[]  2[]  3[]  4[]  UN[]    5b Motor Arm - Right 0[]  1[]  2[]  3[]  4[]  UN[]    6a Motor Leg - Left 0[]  1[]  2[]  3[]  4[]  UN[]    6b Motor Leg - Right 0[]  1[]  2[]  3[]  4[]  UN[]    7 Limb Ataxia 0[]  1[]  2[]  UN[]      8 Sensory 0[]  1[x]  2[]  UN[]      9 Best Language 0[]  1[]  2[]  3[]      10 Dysarthria 0[]  1[]  2[]  UN[]      11 Extinct. and Inattention 0[]  1[]  2[]       TOTAL:1       ROS  Comprehensive ROS performed and pertinent positives documented in HPI   Past History   Past Medical History:  Diagnosis Date   History of chlamydia    Infection 2011   CHLAMYDIA   Morbid obesity with BMI of 45.0-49.9, adult [facility])     Past Surgical History:  Procedure Laterality Date   ARTHROSCOPIC REPAIR ACL     CARPAL TUNNEL RELEASE Left 09/05/2023   Procedure: LEFT CARPAL TUNNEL RELEASE;  Surgeon: Murrell Drivers, MD;  Location: [facility];  Service:  Orthopedics;  Laterality: Left;  30 MIN   HYMENECTOMY     WISDOM TOOTH EXTRACTION     x 4    Family History: Family History  Problem Relation Age of Onset   Hypertension Mother    Heart disease Mother    Arthritis Mother    Bronchiolitis Brother    Bronchiolitis Sister     Social History  reports that she has never smoked. She has never used smokeless tobacco. She reports current alcohol use. She reports that she does not use drugs.  Allergies  Allergen Reactions   Lavender Oil Cough, Hives, Itching, Other (See Comments), Rash, Shortness Of Breath and Swelling   Meloxicam Other (See Comments)    Hot flashed and skin feels like its crawling.    Medications  No current facility-administered medications for this encounter.  Current Outpatient Medications:    ibuprofen (ADVIL) 400 MG tablet, Take 400 mg by mouth every 6 (six) hours as needed., Disp: , Rfl:    naproxen sodium (ALEVE) 220 MG tablet, Take 220  mg by mouth., Disp: , Rfl:    traMADol  (ULTRAM ) 50 MG tablet, 1-2 tabs PO q6 hours prn pain, Disp: 20 tablet, Rfl: 0   valACYclovir (VALTREX) 1000 MG tablet, Take 1,000 mg by mouth 2 (two) times daily. (Patient not taking: Reported on 08/29/2023), Disp: , Rfl:    Vitamin D, Ergocalciferol, (DRISDOL) 1.25 MG (50000 UNIT) CAPS capsule, Take 50,000 Units by mouth every 7 (seven) days., Disp: , Rfl:   Vitals   There were no vitals filed for this visit.  There is no height or weight on file to calculate BMI.   Physical Exam   Constitutional: Appears well-developed and well-nourished.  Psych: Affect appropriate to situation.  Eyes: No scleral injection.  HENT: No OP obstruction.  Head: Normocephalic.  Cardiovascular: Normal rate and regular rhythm.  Respiratory: Effort normal, non-labored breathing.  GI: Soft.  No distension. There is no tenderness.  Skin: WDI.   Neurologic Examination    Neuro: Mental Status: Patient is awake, alert, oriented to person, place,  month, year, and situation. Patient is able to give a clear and coherent history. No signs of aphasia or neglect Cranial Nerves: II: Visual Fields are full. Pupils are equal, round, and reactive to light.   III,IV, VI: EOMI without ptosis or diploplia.  V: Facial sensation is symmetric to temperature VII: Facial movement is symmetric resting and smiling VIII: Hearing is intact to voice X: Palate elevates symmetrically XI: Shoulder shrug is symmetric. XII: Tongue protrudes midline without atrophy or fasciculations.  Motor: Tone is normal. Bulk is normal. 5/5 strength was present in all four extremities.  Sensory: Subjective diminished sensation on the left Cerebellar: FNF and HKS are intact bilaterally       Labs/Imaging/Neurodiagnostic studies   CBC: No results for input(s): WBC, NEUTROABS, HGB, HCT, MCV, PLT in the last 168 hours. Basic Metabolic Panel:  Lab Results  Component Value Date   NA 136 08/22/2022   K 3.8 08/22/2022   CO2 26 08/22/2022   GLUCOSE 107 (H) 08/22/2022   BUN 12 08/22/2022   CREATININE 0.66 08/22/2022   CALCIUM 8.8 (L) 08/22/2022   GFRNONAA >60 08/22/2022   GFRAA 146 12/22/2016   Lipid Panel:  Lab Results  Component Value Date   LDLCALC 100 (H) 12/16/2017   HgbA1c:  Lab Results  Component Value Date   HGBA1C 5.7 12/16/2017   Urine Drug Screen: No results found for: LABOPIA, COCAINSCRNUR, LABBENZ, AMPHETMU, THCU, LABBARB  Alcohol Level No results found for: ETH INR No results found for: INR APTT No results found for: APTT AED levels: No results found for: PHENYTOIN, ZONISAMIDE, LAMOTRIGINE, LEVETIRACETA  CT Head without contrast(Personally reviewed): No acute intracranial abnormality. ASPECTS score is 10.  MR Angio head without contrast and Carotid Duplex BL(Personally reviewed): No significant stenosis of the intracranial vasculature.   MRI Brain(Personally reviewed): No acute abnormality.    ASSESSMENT   Sonya Schwartz is a 33 y.o. female with a PMH of obesity presenting with dizziness and light headedness that she noticed when she woke up around 2 am. She then developed weakness and diminished sensation on the left and she was brought to [facility] ED from urgent care via EMS. CT Head negative for acute hemorrhage. Symptoms improved after CT head. Unable to obtain labs prior to imaging. Patient feeling near baseline prior to MRI and MRA which were both unremarkable. Discussed with EDP and code stroke cancelled. Neurology will remain available as needed.   RECOMMENDATIONS  - Code stroke cancelled - Hand  off to ED completed ______________________________________________________________________    Signed, Jorene Last, NP [facility] I have personally obtained history,examined this patient, reviewed notes, independently viewed imaging studies, participated in medical decision making and plan of care.ROS completed by me personally and pertinent positives fully documented  I have made any additions or clarifications directly to the above note. Agree with note above.  33 year old African-American lady with no significant vascular risk factors except obesity presents with sudden onset of dizziness lightheadedness upon arising this morning.  Subsequently went to being evaluated in urgent care as she developed tingling in the left upper extremity which was transient and appears to be improving by the time she was evaluated in the ER.  CT head is unremarkable and MR brain angiogram shows no large vessel stenosis or occlusion.  Emergent MRI scan of the brain was obtained which shows no acute infarct.  MRI of the brain shows no large vessel stenosis or occlusion.  Etiology of patient's symptoms indeterminate but possibly from underlying likely stress and anxiety.  No further neurological workup is necessary.  Case discussed with Dr. Garrick ER MD   I personally spent a total of 55  minutes in the care of the patient today including getting/reviewing separately obtained history, performing a medically appropriate exam/evaluation, counseling and educating, placing orders, referring and communicating with other health care professionals, documenting clinical information in the EHR, independently interpreting results, and coordinating care.        Sonya Popp, MD Medical Director [facility] Pager: [phone] 08/30/2024 5:40 PM

## 2024-08-30 NOTE — Discharge Instructions (Signed)
 As discussed, your evaluation today has been largely reassuring.  But, it is important that you monitor your condition carefully, and do not hesitate to return to the ED if you develop new, or concerning changes in your condition. ? ?Otherwise, please follow-up with your physician for appropriate ongoing care. ? ?

## 2024-08-30 NOTE — ED Provider Notes (Signed)
 [place] [facility] AT [facility] Provider Note   CSN: [id number] Arrival date & time: 08/30/24  1223  An emergency department physician performed an initial assessment on this suspected stroke patient at 52.  Patient presents with: No chief complaint on file.   Sonya Schwartz is a 33 y.o. female.   HPI Patient presents as a code stroke.  Patient presents with dizziness, lightheadedness, upon awakening.  When she woke up again she noticed blurry vision on the left, numbness, tingling left side.  EMS reports no objective deficiency, LVO negative.  Patient denies pain.    Prior to Admission medications   Medication Sig Start Date End Date Taking? Authorizing Provider  Vitamin D, Ergocalciferol, (DRISDOL) 1.25 MG (50000 UNIT) CAPS capsule Take 50,000 Units by mouth every 7 (seven) days.   Yes [provider]  traMADol  (ULTRAM ) 50 MG tablet 1-2 tabs PO q6 hours prn pain Patient not taking: Reported on 08/30/2024 09/05/23   Kuzma, Kevin, MD  valACYclovir (VALTREX) 1000 MG tablet Take 1,000 mg by mouth 2 (two) times daily. Patient not taking: Reported on 08/29/2023    [provider]    Allergies: Lavender oil and Meloxicam    Review of Systems  Updated Vital Signs BP 134/73   Pulse 62   Temp 98.1 F (36.7 C) (Oral)   Resp 15   Ht 1.524 m (5')   Wt 117.9 kg   SpO2 100%   BMI 50.78 kg/m   Physical Exam Vitals and nursing note reviewed.  Constitutional:      General: She is not in acute distress.    Appearance: She is well-developed.  HENT:     Head: Normocephalic and atraumatic.  Eyes:     Conjunctiva/sclera: Conjunctivae normal.  Cardiovascular:     Rate and Rhythm: Normal rate and regular rhythm.  Pulmonary:     Effort: Pulmonary effort is normal. No respiratory distress.     Breath sounds: No stridor.  Abdominal:     General: There is no distension.  Skin:    General: Skin is warm and dry.  Neurological:      General: No focal deficit present.     Mental Status: She is alert and oriented to person, place, and time.     Cranial Nerves: No cranial nerve deficit.  Psychiatric:        Mood and Affect: Mood normal.     (all labs ordered are listed, but only abnormal results are displayed) Labs Reviewed  COMPREHENSIVE METABOLIC PANEL WITH GFR - Abnormal; Notable for the following components:      Result Value   Calcium 8.7 (*)    All other components within normal limits  PROTIME-INR  APTT  CBC  DIFFERENTIAL  ETHANOL  HCG, SERUM, QUALITATIVE  I-STAT CHEM 8, ED  CBG MONITORING, ED    EKG: None  Radiology: MR ANGIO HEAD WO CONTRAST Result Date: 08/30/2024 EXAM: MR Angiography Head without intravenous contrast. 08/30/2024 01:20:37 PM TECHNIQUE: Magnetic resonance angiography images of the head without intravenous contrast. Multiplanar 2D and 3D reformatted images are provided for review. COMPARISON: None provided. CLINICAL HISTORY: Neuro deficit, acute, stroke suspected FINDINGS: ANTERIOR CIRCULATION: No significant stenosis of the internal carotid arteries. No significant stenosis of the anterior cerebral arteries. No significant stenosis of the middle cerebral arteries. No aneurysm. POSTERIOR CIRCULATION: No significant stenosis of the posterior cerebral arteries. No significant stenosis of the basilar artery. No significant stenosis of the vertebral arteries. No aneurysm. IMPRESSION: 1. No  significant stenosis of the intracranial vasculature. Electronically signed by: Gilmore Molt MD 08/30/2024 01:37 PM EST RP Workstation: [id number]   MR BRAIN WO CONTRAST Result Date: 08/30/2024 EXAM: MRI Brain Without Contrast 08/30/2024 01:19:02 PM TECHNIQUE: Multiplanar multisequence MRI of the head/brain was performed without the administration of intravenous contrast. COMPARISON: CT head from earlier today. CLINICAL HISTORY: Neuro deficit, acute, stroke suspected FINDINGS: BRAIN AND VENTRICLES: No  acute infarct. No intracranial hemorrhage. No mass. No midline shift. No hydrocephalus. The sella is unremarkable. Normal flow voids. ORBITS: No acute abnormality. SINUSES AND MASTOIDS: No acute abnormality. BONES AND SOFT TISSUES: Normal marrow signal. No acute soft tissue abnormality. IMPRESSION: Electronically signed by: Gilmore Molt MD 08/30/2024 01:35 PM EST RP Workstation: [id number]   CT HEAD CODE STROKE WO CONTRAST Result Date: 08/30/2024 EXAM: CT HEAD WITHOUT CONTRAST 08/30/2024 12:35:00 PM TECHNIQUE: CT of the head was performed without the administration of intravenous contrast. Automated exposure control, iterative reconstruction, and/or weight based adjustment of the mA/kV was utilized to reduce the radiation dose to as low as reasonably achievable. COMPARISON: None available. CLINICAL HISTORY: Neuro deficit, acute, stroke suspected FINDINGS: BRAIN AND VENTRICLES: No acute hemorrhage. No evidence of acute infarct. No hydrocephalus. No extra-axial collection. No mass effect or midline shift. ORBITS: No acute abnormality. SINUSES: No acute abnormality. SOFT TISSUES AND SKULL: No acute soft tissue abnormality. No skull fracture. [place] Stroke Program Early CT Score (ASPECTS) ----- Ganglionic (caudate, IC, lentiform nucleus, insula, M1-M3): 7 Supraganglionic (M4-M6): 3 Total: 10 IMPRESSION: 1. No acute intracranial abnormality. 2. ASPECTS score is 10. 3. These results were communicated to Dr. Rosemarie at 12:42 PM on 08/30/2024 by secure text page via the [facility] messaging system. Electronically signed by: Lonni Necessary MD 08/30/2024 12:44 PM EST RP Workstation: [id number]     Procedures   Medications Ordered in the ED  sodium chloride  flush (NS) 0.9 % injection 3 mL (3 mLs Intravenous Given 08/30/24 1432)                                    Medical Decision Making Previously well adult female with history of migraines, obesity presents with numbness, tingling, intermittent vision  change. Broad differential including complex migraine, stroke, mass.  Patient's case evaluated expeditiously with our neurology colleague, with patent airway she had stat CT. Upon return from CT, without alarming findings, discussed her case with our neurology colleagues patient had stat MRI. Patient's vital signs reassuring cardiac 60 sinus normal pulse ox 100% room air normal  Amount and/or Complexity of Data Reviewed Independent Historian:     Details: Mother at bedside eventually Labs:  Decision-making details documented in ED Course. Radiology: independent interpretation performed. Decision-making details documented in ED Course.  Risk Decision regarding hospitalization.   Patient in no distress, no ongoing complaints, hemodynamically unremarkable, mother at bedside.  We discussed all findings including CT, CTA, MRI, labs.  No evidence for acute stroke, mass, substantial abnormalities and with no ongoing symptoms, suspicion for complex migraine, patient will follow-up with neurology for appropriate ongoing outpatient management.     Final diagnoses:  Numbness    ED Discharge Orders     None          Garrick Charleston, MD 08/30/24 1527

## 2024-09-04 DIAGNOSIS — R42 Dizziness and giddiness: Secondary | ICD-10-CM | POA: Diagnosis not present

## 2024-09-04 DIAGNOSIS — Z13220 Encounter for screening for lipoid disorders: Secondary | ICD-10-CM | POA: Diagnosis not present

## 2024-09-11 DIAGNOSIS — F4389 Other reactions to severe stress: Secondary | ICD-10-CM | POA: Diagnosis not present

## 2024-09-17 DIAGNOSIS — F4389 Other reactions to severe stress: Secondary | ICD-10-CM | POA: Diagnosis not present

## 2024-09-26 DIAGNOSIS — F4389 Other reactions to severe stress: Secondary | ICD-10-CM | POA: Diagnosis not present

## 2025-01-29 ENCOUNTER — Ambulatory Visit: Admitting: Neurology
